# Patient Record
Sex: Female | Born: 1995 | Race: White | Hispanic: No | Marital: Married | Smoking: Never smoker
Health system: Southern US, Community
[De-identification: ages and names within clinical notes are randomized; demographics above are authoritative.]

## PROBLEM LIST (undated history)

## (undated) DIAGNOSIS — N926 Irregular menstruation, unspecified: Secondary | ICD-10-CM

## (undated) DIAGNOSIS — E669 Obesity, unspecified: Secondary | ICD-10-CM

## (undated) HISTORY — PX: TONSILLECTOMY: SUR1361

## (undated) HISTORY — DX: Irregular menstruation, unspecified: N92.6

## (undated) HISTORY — DX: Obesity, unspecified: E66.9

---
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
## 2015-10-28 DIAGNOSIS — Z01419 Encounter for gynecological examination (general) (routine) without abnormal findings: Secondary | ICD-10-CM | POA: Diagnosis not present

## 2015-10-28 DIAGNOSIS — Z30011 Encounter for initial prescription of contraceptive pills: Secondary | ICD-10-CM | POA: Diagnosis not present

## 2015-10-28 DIAGNOSIS — N898 Other specified noninflammatory disorders of vagina: Secondary | ICD-10-CM | POA: Diagnosis not present

## 2016-01-31 ENCOUNTER — Other Ambulatory Visit: Payer: Self-pay | Admitting: Family Medicine

## 2016-01-31 DIAGNOSIS — R1011 Right upper quadrant pain: Secondary | ICD-10-CM

## 2016-02-01 ENCOUNTER — Ambulatory Visit
Admission: RE | Admit: 2016-02-01 | Discharge: 2016-02-01 | Disposition: A | Discharge status: Home / Self Care | Payer: 59 | Source: Ambulatory Visit | Attending: Family Medicine | Admitting: Family Medicine

## 2016-02-01 DIAGNOSIS — R1011 Right upper quadrant pain: Secondary | ICD-10-CM | POA: Insufficient documentation

## 2016-07-03 IMAGING — US US ABDOMEN LIMITED
1 series · 14 of 25 positions shown · non-contrast
Comparison: None in PACs

CLINICAL DATA: Right upper quadrant pain for the past 3 days
without nausea or vomiting.

EXAM:
US ABDOMEN LIMITED - RIGHT UPPER QUADRANT

[Series 1: us abdomen limited · 0.28mm/px · 14 of 49 slices shown]
[im 1/49]
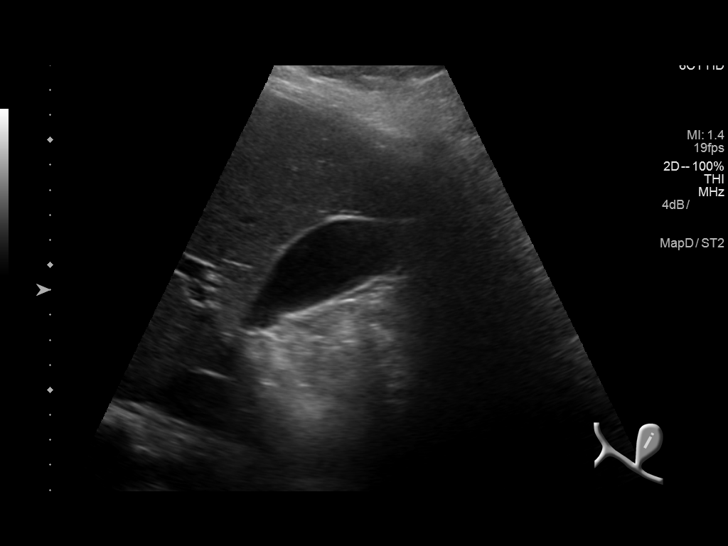
[im 5/49]
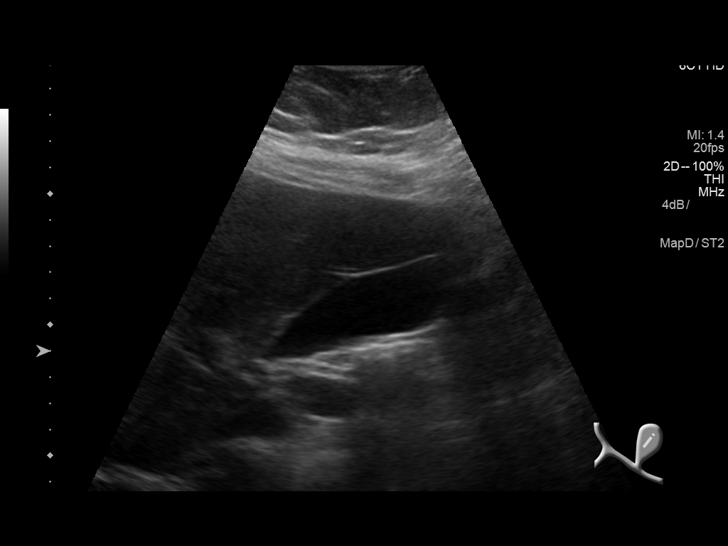
[im 9/49]
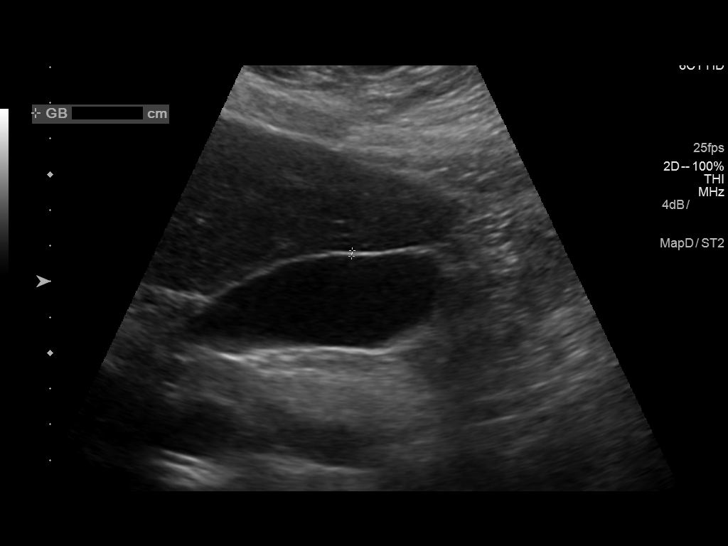
[im 13/49]
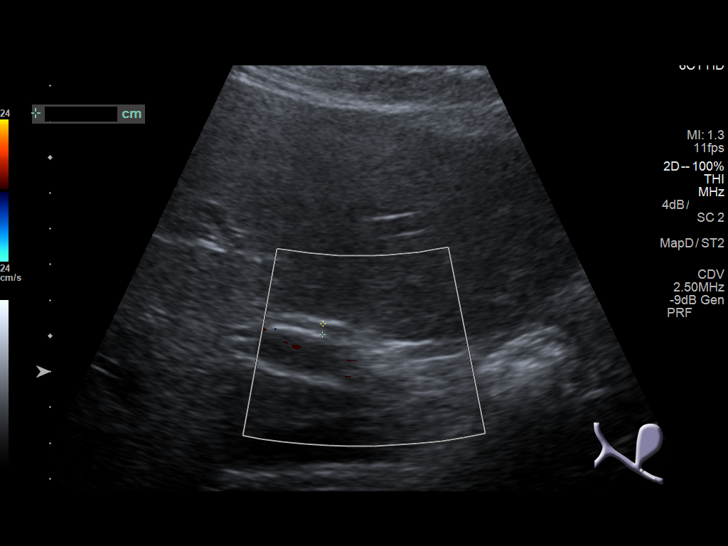
[im 17/49]
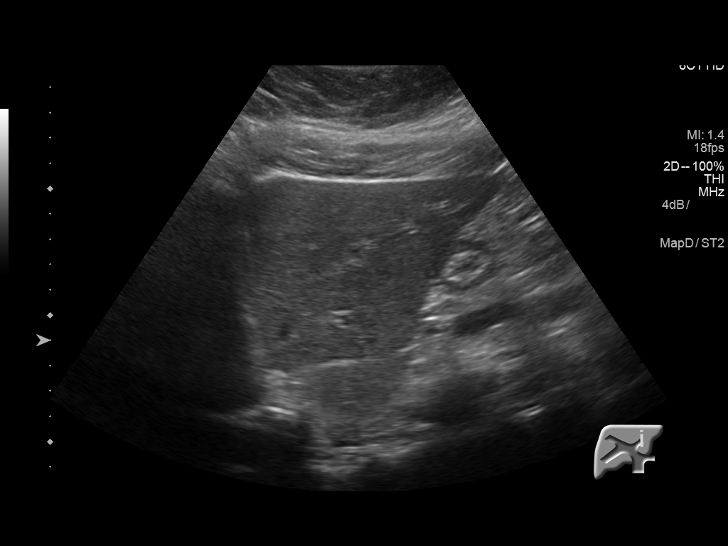
[im 19/49]
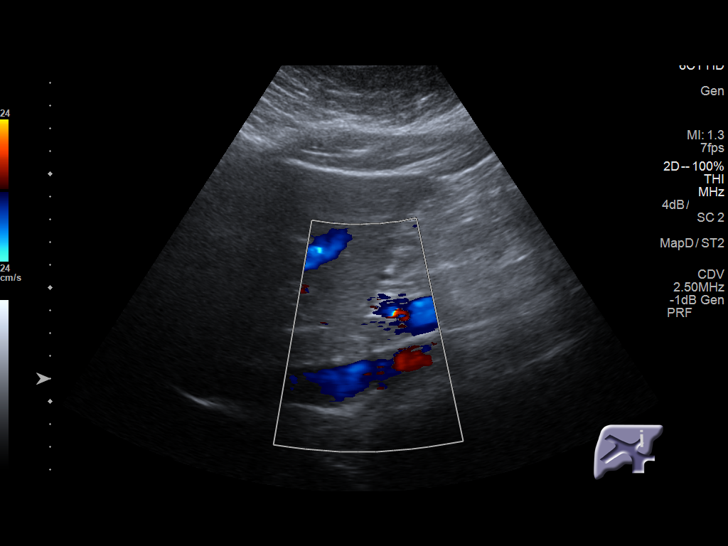
[im 23/49]
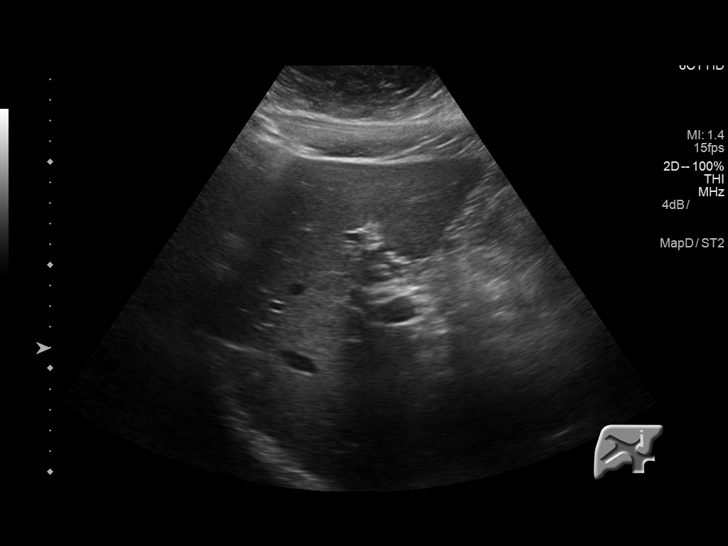
[im 27/49]
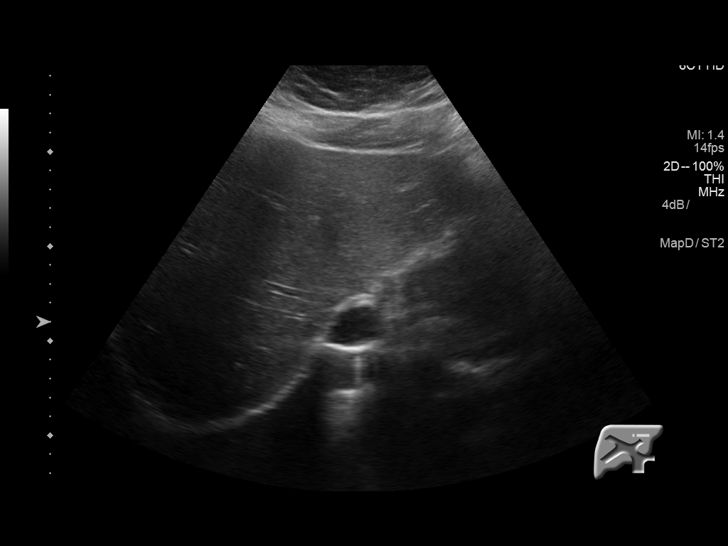
[im 31/49]
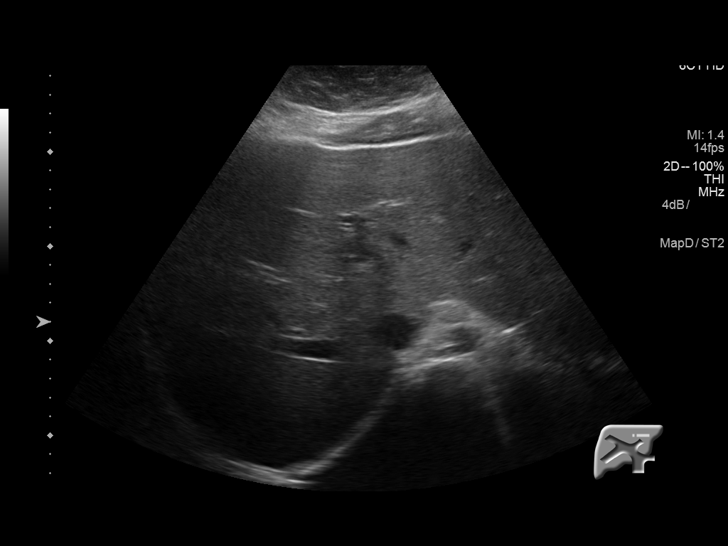
[im 33/49]
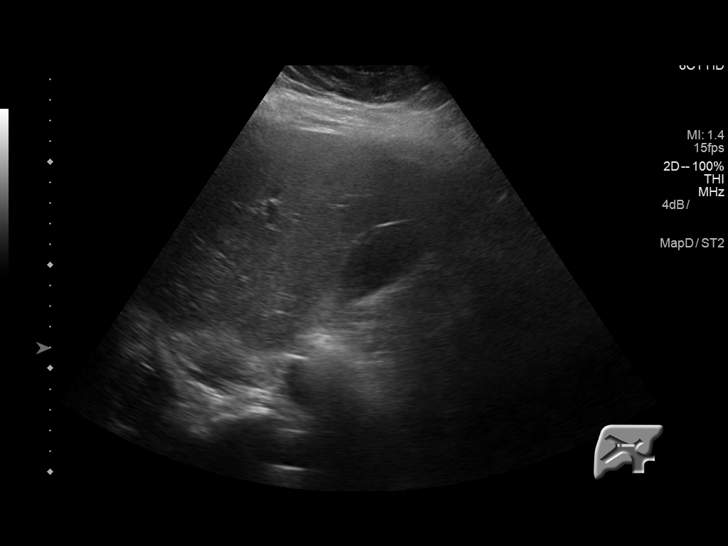
[im 37/49]
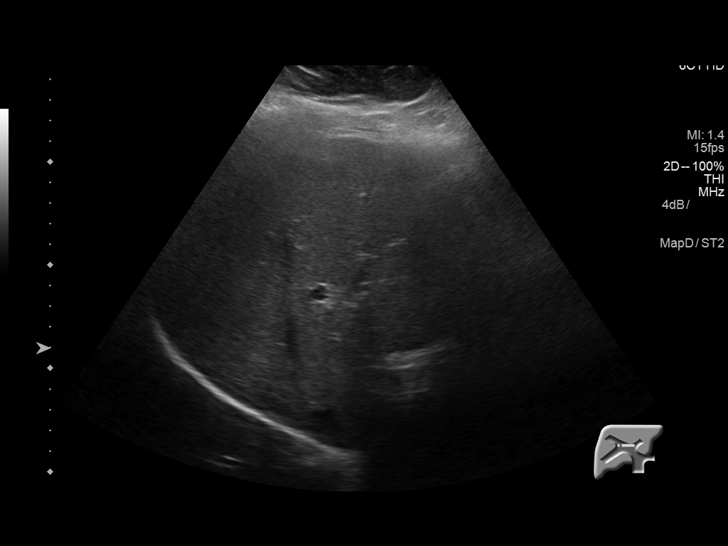
[im 41/49]
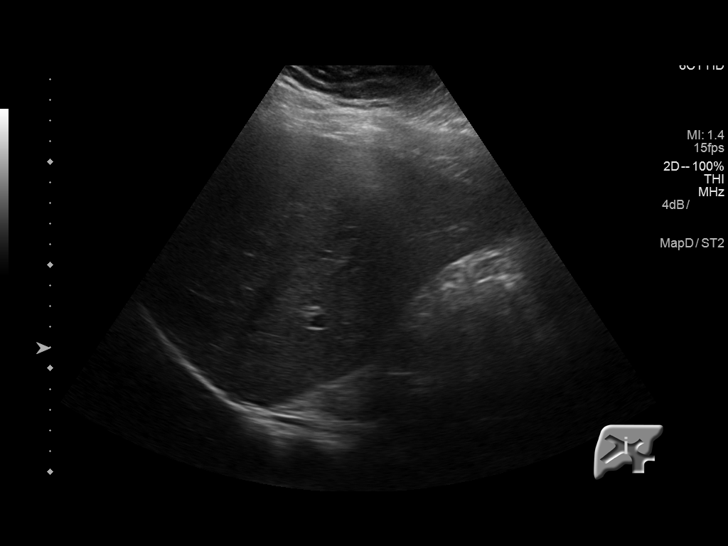
[im 45/49]
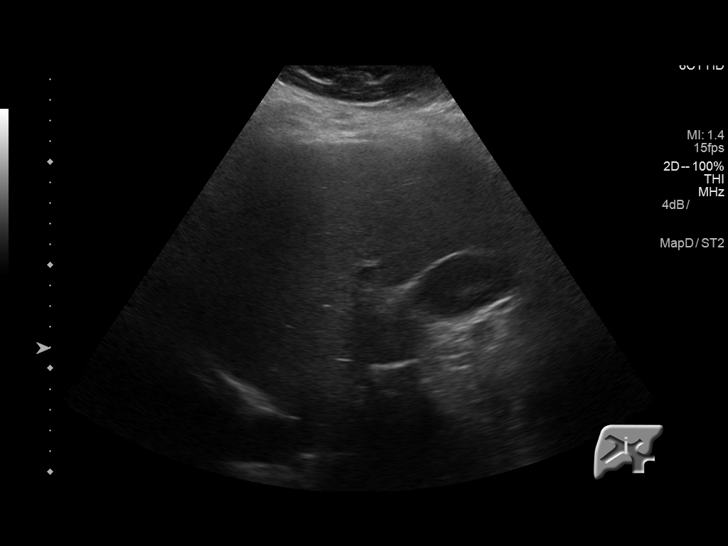
[im 49/49]
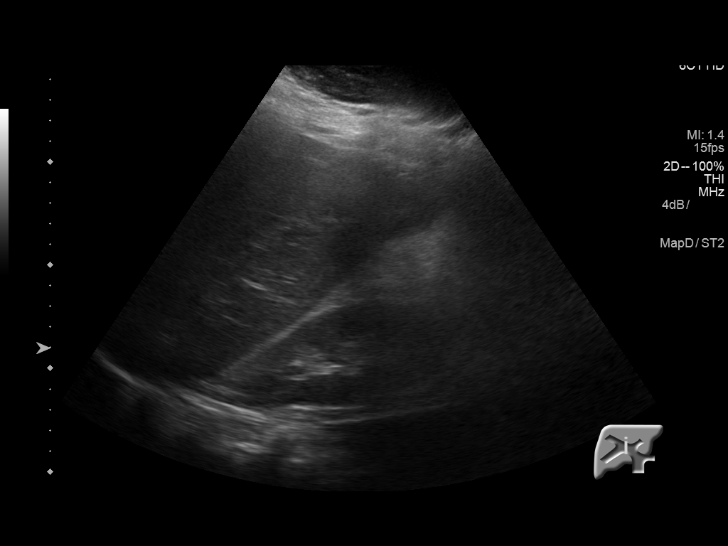

[14 of 25 positions shown; findings below may reference images not displayed]

FINDINGS: Gallbladder:

The gallbladder is adequately distended with no evidence of stones,
wall thickening, or pericholecystic fluid. There is no positive
sonographic Murphy's sign.

Common bile duct:

Diameter: 3 mm

Liver:

The hepatic echotexture is normal. There is no focal mass nor ductal
dilation.
IMPRESSION: Normal limited right upper quadrant ultrasound examination. If
gallbladder dysfunction is felt to be likely, a nuclear medicine
hepatobiliary scan with gallbladder ejection fraction may be useful.

## 2016-11-01 DIAGNOSIS — Z01419 Encounter for gynecological examination (general) (routine) without abnormal findings: Secondary | ICD-10-CM | POA: Diagnosis not present

## 2017-06-11 ENCOUNTER — Telehealth: Payer: Self-pay | Admitting: Internal Medicine

## 2017-06-11 NOTE — Telephone Encounter (Signed)
Pt will call to set up a new patient appt with Dr. Darrick Huntsmanullo. Dr. Darrick Huntsmanullo has ok'd this.-kp

## 2017-07-03 ENCOUNTER — Ambulatory Visit (INDEPENDENT_AMBULATORY_CARE_PROVIDER_SITE_OTHER): Payer: 59 | Admitting: Internal Medicine

## 2017-07-03 ENCOUNTER — Encounter: Payer: Self-pay | Admitting: Internal Medicine

## 2017-07-03 VITALS — BP 110/72 | HR 69 | Temp 98.3°F | Resp 15 | Ht 68.25 in | Wt 230.2 lb

## 2017-07-03 DIAGNOSIS — E669 Obesity, unspecified: Secondary | ICD-10-CM

## 2017-07-03 DIAGNOSIS — Z23 Encounter for immunization: Secondary | ICD-10-CM | POA: Diagnosis not present

## 2017-07-03 DIAGNOSIS — R5383 Other fatigue: Secondary | ICD-10-CM | POA: Diagnosis not present

## 2017-07-03 LAB — TSH: TSH: 2.26 u[IU]/mL (ref 0.35–4.50)

## 2017-07-03 LAB — COMPREHENSIVE METABOLIC PANEL
ALBUMIN: 4 g/dL (ref 3.5–5.2)
ALK PHOS: 57 U/L (ref 39–117)
ALT: 12 U/L (ref 0–35)
AST: 12 U/L (ref 0–37)
BILIRUBIN TOTAL: 0.3 mg/dL (ref 0.2–1.2)
BUN: 13 mg/dL (ref 6–23)
CO2: 28 mEq/L (ref 19–32)
CREATININE: 0.55 mg/dL (ref 0.40–1.20)
Calcium: 9.8 mg/dL (ref 8.4–10.5)
Chloride: 104 mEq/L (ref 96–112)
GFR: 144.48 mL/min (ref >60.00)
Glucose, Bld: 85 mg/dL (ref 70–99)
Potassium: 4.2 mEq/L (ref 3.5–5.1)
SODIUM: 137 meq/L (ref 135–145)
TOTAL PROTEIN: 7.1 g/dL (ref 6.0–8.3)

## 2017-07-03 NOTE — Patient Instructions (Signed)
Good to meet you!  I'll see you in a year,  Sooner if needed

## 2017-07-03 NOTE — Progress Notes (Signed)
   Subjective:  Patient ID: Kristina Kelly, female    DOB: 01/13/1996  Age: 21 y.o. MRN: [id number]  CC: There were no encounter diagnoses.  HPI Kristina Kelly presents for establishment of care,  She was referred by her mother and her husband    She has a history of morbid obesity,  Has lost 60 lbs since March using phentermine and B12  Injections that were previously prescribed by her  OB GYN.   She and her husband have been losing weight together by following the KET[employer] diet and exercising regularly.  Kristina Kelly has no past medical history on file.   She has no past surgical history on file.   Her family history is not on file.She has no tobacco, alcohol, and drug history on file.  No outpatient medications prior to visit.   No facility-administered medications prior to visit.     Review of Systems:  Patient denies headache, fevers, malaise, unintentional weight loss, skin rash, eye pain, sinus congestion and sinus pain, sore throat, dysphagia,  hemoptysis , cough, dyspnea, wheezing, chest pain, palpitations, orthopnea, edema, abdominal pain, nausea, melena, diarrhea, constipation, flank pain, dysuria, hematuria, urinary  Frequency, nocturia, numbness, tingling, seizures,  Focal weakness, Loss of consciousness,  Tremor, insomnia, depression, anxiety, and suicidal ideation.     Objective:  BP 110/72 (BP Location: Left Arm, Patient Position: Sitting, Cuff Size: Large)   Pulse 69   Temp 98.3 F (36.8 C) (Oral)   Resp 15   Ht 5' 8.25" (1.734 m)   Wt 230 lb 3.2 oz (104.4 kg)   SpO2 97%   BMI 34.75 kg/m   Physical Exam:  General appearance: alert, cooperative and appears stated age Ears: normal TM's and external ear canals both ears Throat: lips, mucosa, and tongue normal; teeth and gums normal Neck: no adenopathy, no carotid bruit, supple, symmetrical, trachea midline and thyroid not enlarged, symmetric, no tenderness/mass/nodules Back: symmetric, no curvature. ROM normal. No CVA  tenderness. Lungs: clear to auscultation bilaterally Heart: regular rate and rhythm, S1, S2 normal, no murmur, click, rub or gallop Abdomen: soft, non-tender; bowel sounds normal; no masses,  no organomegaly Pulses: 2+ and symmetric Skin: Skin color, texture, turgor normal. No rashes or lesions Lymph nodes: Cervical, supraclavicular, and axillary nodes normal.   Assessment & Plan:   Problem List Items Addressed This Visit    None      Kristina ParrSarah M. Kelly does not currently have medications on file.  No orders of the defined types were placed in this encounter.   There are no discontinued medications.  Follow-up: No Follow-up on file.   Sherlene Shamseresa L Lorenia Hoston, MD

## 2017-07-05 ENCOUNTER — Encounter: Payer: Self-pay | Admitting: Internal Medicine

## 2017-07-05 DIAGNOSIS — R5383 Other fatigue: Secondary | ICD-10-CM | POA: Insufficient documentation

## 2017-07-05 DIAGNOSIS — E669 Obesity, unspecified: Secondary | ICD-10-CM | POA: Insufficient documentation

## 2017-07-05 NOTE — Assessment & Plan Note (Signed)
I have congratulated her in reduction of   BMI and encouraged  Continued weight loss with goal of 10% of body weigh over the next 6 months using a low glycemic index diet and regular exercise a minimum of 5 days per week.    

## 2017-07-05 NOTE — Assessment & Plan Note (Signed)
Etiology unclear.  Will screen for thyroid, anemia,  hepatic and renal insufficiency, encourage regular exercise 5 days /week,  consider sleep study and cardiology evaluation if  Snoring noted or exertional dyspnea reported 

## 2017-11-05 DIAGNOSIS — Z01419 Encounter for gynecological examination (general) (routine) without abnormal findings: Secondary | ICD-10-CM | POA: Diagnosis not present

## 2018-01-02 ENCOUNTER — Encounter: Payer: Self-pay | Admitting: Internal Medicine

## 2018-01-03 ENCOUNTER — Telehealth: Payer: Self-pay

## 2018-01-03 NOTE — Telephone Encounter (Signed)
LMTCB. Need to let pt know that we have a form for her that has been completed. There is not a fax number on it so we weren't sure if we needed to fax it or have her to come by and pick it up. PEC may speak with pt.

## 2018-03-04 ENCOUNTER — Encounter: Payer: Self-pay | Admitting: Internal Medicine

## 2018-03-05 ENCOUNTER — Encounter: Payer: Self-pay | Admitting: Internal Medicine

## 2018-03-06 ENCOUNTER — Ambulatory Visit: Payer: 59 | Admitting: Internal Medicine

## 2018-05-29 DIAGNOSIS — Z23 Encounter for immunization: Secondary | ICD-10-CM | POA: Diagnosis not present

## 2018-08-14 NOTE — L&D Delivery Note (Signed)
       Delivery Note   Kristina Kelly is a 23 y.o. G1P0 at [redacted]w[redacted]d Estimated Date of Delivery: 08/16/19  PRE-OPERATIVE DIAGNOSIS:  1) [redacted]w[redacted]d pregnancy.   POST-OPERATIVE DIAGNOSIS:  1) [redacted]w[redacted]d pregnancy s/p Vaginal, Spontaneous   Delivery Type: Vaginal, Spontaneous    Delivery Anesthesia: Epidural   Labor Complications:  compound presentation    ESTIMATED BLOOD LOSS: 230  ml    FINDINGS:   1) female infant, Apgar scores of 8   at 1 minute and 8   at 5 minutes and a birthweight of 107.23  ounces.    2) Nuchal cord: yes across shoulder  SPECIMENS:   PLACENTA:   Appearance: Intact , 3 vessel cord, cod blood sample collected   Removal: Spontaneous      Disposition:  held per protocol then discarded  DISPOSITION:  Infant to left in stable condition in the delivery room, with L&D personnel and mother,  NARRATIVE SUMMARY: Labor course:  Ms. Kristina Kelly is a G1P0 at [redacted]w[redacted]d who presented for induction of labor.  She progressed well in labor with pitocin.  She received the appropriate epidural anesthesia and proceeded to complete dilation. She evidenced good maternal expulsive effort during the second stage. She went on to deliver a viable female infant 'Adalyn". The placenta delivered without problems and was noted to be complete. A perineal and vaginal examination was performed. Episiotomy/Lacerations: 2nd degree;Perineal  Laceration was repaired with 3-0 Vicryl Rapide suture using. The patient tolerated this well.  Philip Aspen, CNM  08/14/2019 9:18 AM

## 2019-01-02 ENCOUNTER — Telehealth: Payer: Self-pay | Admitting: *Deleted

## 2019-01-02 NOTE — Telephone Encounter (Signed)
Coronavirus (COVID-19) Are you at risk?  Are you at risk for the Coronavirus (COVID-19)?  To be considered HIGH RISK for Coronavirus (COVID-19), you have to meet the following criteria:  . Traveled to [place], [place], [place], [place] or [place]; or in the [place] to [place], [place], [place], or [place]; and have fever, cough, and shortness of breath within the last 2 weeks of travel OR . Been in close contact with a person diagnosed with COVID-19 within the last 2 weeks and have fever, cough, and shortness of breath . IF YOU DO NOT MEET THESE CRITERIA, YOU ARE CONSIDERED LOW RISK FOR COVID-19.  What to do if you are HIGH RISK for COVID-19?  . If you are having a medical emergency, call 911. . Seek medical care right away. Before you go to a doctor's office, urgent care or emergency department, call ahead and tell them about your recent travel, contact with someone diagnosed with COVID-19, and your symptoms. You should receive instructions from your physician's office regarding next steps of care.  . When you arrive at healthcare provider, tell the healthcare staff immediately you have returned from visiting [place], [place], [place], [place] or [place]; or traveled in the [place] to [place], [place], [place], or [place]; in the last two weeks or you have been in close contact with a person diagnosed with COVID-19 in the last 2 weeks.   . Tell the health care staff about your symptoms: fever, cough and shortness of breath. . After you have been seen by a medical provider, you will be either: o Tested for (COVID-19) and discharged home on quarantine except to seek medical care if symptoms worsen, and asked to  - Stay home and avoid contact with others until you get your results (4-5 days)  - Avoid travel on public transportation if possible (such as bus, train, or airplane) or o Sent to the Emergency Department by EMS for evaluation, COVID-19 testing, and possible  admission depending on your condition and test results.  What to do if you are LOW RISK for COVID-19?  Reduce your risk of any infection by using the same precautions used for avoiding the common cold or flu:  . Wash your hands often with soap and warm water for at least 20 seconds.  If soap and water are not readily available, use an alcohol-based hand sanitizer with at least 60% alcohol.  . If coughing or sneezing, cover your mouth and nose by coughing or sneezing into the elbow areas of your shirt or coat, into a tissue or into your sleeve (not your hands). . Avoid shaking hands with others and consider head nods or verbal greetings only. . Avoid touching your eyes, nose, or mouth with unwashed hands.  . Avoid close contact with people who are sick. . Avoid places or events with large numbers of people in one location, like concerts or sporting events. . Carefully consider travel plans you have or are making. . If you are planning any travel outside or inside the [place], visit the CDC's Travelers' Health webpage for the latest health notices. . If you have some symptoms but not all symptoms, continue to monitor at home and seek medical attention if your symptoms worsen. . If you are having a medical emergency, call 911.   ADDITIONAL HEALTHCARE OPTIONS FOR PATIENTS  [facility] / e-Visit: [email or web address]         [facility]: [phone]     Urgent Care: [phone]                   [facility]: [phone]   Spoke with pt denies any sx.  Amy Clontz, CMA 

## 2019-01-03 ENCOUNTER — Encounter: Payer: Self-pay | Admitting: Obstetrics and Gynecology

## 2019-01-03 ENCOUNTER — Other Ambulatory Visit: Payer: Self-pay

## 2019-01-03 ENCOUNTER — Ambulatory Visit (INDEPENDENT_AMBULATORY_CARE_PROVIDER_SITE_OTHER): Payer: 59 | Admitting: Obstetrics and Gynecology

## 2019-01-03 VITALS — BP 110/75 | HR 73 | Ht 69.0 in | Wt 239.5 lb

## 2019-01-03 DIAGNOSIS — Z6835 Body mass index (BMI) 35.0-35.9, adult: Secondary | ICD-10-CM

## 2019-01-03 DIAGNOSIS — N926 Irregular menstruation, unspecified: Secondary | ICD-10-CM

## 2019-01-03 LAB — POCT URINE PREGNANCY: Preg Test, Ur: POSITIVE — AB

## 2019-01-03 NOTE — Patient Instructions (Addendum)
Thank you for enrolling in [place]. Please follow the instructions below to securely access your online medical record. MyChart allows you to send messages to your doctor, view your test results, renew your prescriptions, schedule appointments, and more.  How Do I Sign Up? 1. In your Internet browser, go to [email or web address]. 2. Click on the New  User? link in the Sign In box.  3. Enter your MyChart Access Code exactly as it appears below. You will not need to use this code after you have completed the sign-up process. If you do not sign up before the expiration date, you must request a new code. MyChart Access Code: Activation code not generated Current MyChart Status: Active  4. Enter the last four digits of your Social Security Number (xxxx) and Date of Birth (mm/dd/yyyy) as indicated and click Next. You will be taken to the next sign-up page. 5. Create a MyChart ID. This will be your MyChart login ID and cannot be changed, so think of one that is secure and easy to remember. 6. Create a MyChart password. You can change your password at any time. 7. Enter your Password Reset Question and Answer and click Next. This can be used at a later time if you forget your password.  8. Select your communication preference, and if applicable enter your e-mail address. You will receive e-mail notification when new information is available in MyChart by choosing to receive e-mail notifications and filling in your e-mail. 9. Click Sign In. You can now view your medical record.   Additional Information If you have questions, you can email [email or web address] or call [phone] to talk to our [place] staff. Remember, MyChart is NOT to be used for urgent needs. For medical emergencies, dial 911.   First Trimester of Pregnancy The first trimester of pregnancy is from week 1 until the end of week 13 (months 1 through 3). A week after a sperm fertilizes an egg, the egg will  implant on the wall of the uterus. This embryo will begin to develop into a baby. Genes from you and your partner will form the baby. The female genes will determine whether the baby will be a boy or a girl. At 6-8 weeks, the eyes and face will be formed, and the heartbeat can be seen on ultrasound. At the end of 12 weeks, all the baby's organs will be formed. Now that you are pregnant, you will want to do everything you can to have a healthy baby. Two of the most important things are to get good prenatal care and to follow your health care provider's instructions. Prenatal care is all the medical care you receive before the baby's birth. This care will help prevent, find, and treat any problems during the pregnancy and childbirth. Body changes during your first trimester Your body goes through many changes during pregnancy. The changes vary from woman to woman.  You may gain or lose a couple of pounds at first.  You may feel sick to your stomach (nauseous) and you may throw up (vomit). If the vomiting is uncontrollable, call your health care provider.  You may tire easily.  You may develop headaches that can be relieved by medicines. All medicines should be approved by your health care provider.  You may urinate more often. Painful urination may mean you have a bladder infection.  You may develop heartburn as a result of your pregnancy.  You may develop constipation because certain hormones are causing the  muscles that push stool through your intestines to slow down.  You may develop hemorrhoids or swollen veins (varicose veins).  Your breasts may begin to grow larger and become tender. Your nipples may stick out more, and the tissue that surrounds them (areola) may become darker.  Your gums may bleed and may be sensitive to brushing and flossing.  Dark spots or blotches (chloasma, mask of pregnancy) may develop on your face. This will likely fade after the baby is born.  Your menstrual  periods will stop.  You may have a loss of appetite.  You may develop cravings for certain kinds of food.  You may have changes in your emotions from day to day, such as being excited to be pregnant or being concerned that something may go wrong with the pregnancy and baby.  You may have more vivid and strange dreams.  You may have changes in your hair. These can include thickening of your hair, rapid growth, and changes in texture. Some women also have hair loss during or after pregnancy, or hair that feels dry or thin. Your hair will most likely return to normal after your baby is born. What to expect at prenatal visits During a routine prenatal visit:  You will be weighed to make sure you and the baby are growing normally.  Your blood pressure will be taken.  Your abdomen will be measured to track your baby's growth.  The fetal heartbeat will be listened to between weeks 10 and 14 of your pregnancy.  Test results from any previous visits will be discussed. Your health care provider may ask you:  How you are feeling.  If you are feeling the baby move.  If you have had any abnormal symptoms, such as leaking fluid, bleeding, severe headaches, or abdominal cramping.  If you are using any tobacco products, including cigarettes, chewing tobacco, and electronic cigarettes.  If you have any questions. Other tests that may be performed during your first trimester include:  Blood tests to find your blood type and to check for the presence of any previous infections. The tests will also be used to check for low iron levels (anemia) and protein on red blood cells (Rh antibodies). Depending on your risk factors, or if you previously had diabetes during pregnancy, you may have tests to check for high blood sugar that affects pregnant women (gestational diabetes).  Urine tests to check for infections, diabetes, or protein in the urine.  An ultrasound to confirm the proper growth and  development of the baby.  Fetal screens for spinal cord problems (spina bifida) and Down syndrome.  HIV (human immunodeficiency virus) testing. Routine prenatal testing includes screening for HIV, unless you choose not to have this test.  You may need other tests to make sure you and the baby are doing well. Follow these instructions at home: Medicines  Follow your health care provider's instructions regarding medicine use. Specific medicines may be either safe or unsafe to take during pregnancy.  Take a prenatal vitamin that contains at least 600 micrograms (mcg) of folic acid.  If you develop constipation, try taking a stool softener if your health care provider approves. Eating and drinking   Eat a balanced diet that includes fresh fruits and vegetables, whole grains, good sources of protein such as meat, eggs, or tofu, and low-fat dairy. Your health care provider will help you determine the amount of weight gain that is right for you.  Avoid raw meat and uncooked cheese. These carry  germs that can cause birth defects in the baby.  Eating four or five small meals rather than three large meals a day may help relieve nausea and vomiting. If you start to feel nauseous, eating a few soda crackers can be helpful. Drinking liquids between meals, instead of during meals, also seems to help ease nausea and vomiting.  Limit foods that are high in fat and processed sugars, such as fried and sweet foods.  To prevent constipation: ? Eat foods that are high in fiber, such as fresh fruits and vegetables, whole grains, and beans. ? Drink enough fluid to keep your urine clear or pale yellow. Activity  Exercise only as directed by your health care provider. Most women can continue their usual exercise routine during pregnancy. Try to exercise for 30 minutes at least 5 days a week. Exercising will help you: ? Control your weight. ? Stay in shape. ? Be prepared for labor and  delivery.  Experiencing pain or cramping in the lower abdomen or lower back is a good sign that you should stop exercising. Check with your health care provider before continuing with normal exercises.  Try to avoid standing for long periods of time. Move your legs often if you must stand in one place for a long time.  Avoid heavy lifting.  Wear low-heeled shoes and practice good posture.  You may continue to have sex unless your health care provider tells you not to. Relieving pain and discomfort  Wear a good support bra to relieve breast tenderness.  Take warm sitz baths to soothe any pain or discomfort caused by hemorrhoids. Use hemorrhoid cream if your health care provider approves.  Rest with your legs elevated if you have leg cramps or low back pain.  If you develop varicose veins in your legs, wear support hose. Elevate your feet for 15 minutes, 3-4 times a day. Limit salt in your diet. Prenatal care  Schedule your prenatal visits by the twelfth week of pregnancy. They are usually scheduled monthly at first, then more often in the last 2 months before delivery.  Write down your questions. Take them to your prenatal visits.  Keep all your prenatal visits as told by your health care provider. This is important. Safety  Wear your seat belt at all times when driving.  Make a list of emergency phone numbers, including numbers for family, friends, the hospital, and police and fire departments. General instructions  Ask your health care provider for a referral to a local prenatal education class. Begin classes no later than the beginning of month 6 of your pregnancy.  Ask for help if you have counseling or nutritional needs during pregnancy. Your health care provider can offer advice or refer you to specialists for help with various needs.  Do not use hot tubs, steam rooms, or saunas.  Do not douche or use tampons or scented sanitary pads.  Do not cross your legs for long  periods of time.  Avoid cat litter boxes and soil used by cats. These carry germs that can cause birth defects in the baby and possibly loss of the fetus by miscarriage or stillbirth.  Avoid all smoking, herbs, alcohol, and medicines not prescribed by your health care provider. Chemicals in these products affect the formation and growth of the baby.  Do not use any products that contain nicotine or tobacco, such as cigarettes and e-cigarettes. If you need help quitting, ask your health care provider. You may receive counseling support and other resources  to help you quit.  Schedule a dentist appointment. At home, brush your teeth with a soft toothbrush and be gentle when you floss. Contact a health care provider if:  You have dizziness.  You have mild pelvic cramps, pelvic pressure, or nagging pain in the abdominal area.  You have persistent nausea, vomiting, or diarrhea.  You have a bad smelling vaginal discharge.  You have pain when you urinate.  You notice increased swelling in your face, hands, legs, or ankles.  You are exposed to fifth disease or chickenpox.  You are exposed to [place] measles (rubella) and have never had it. Get help right away if:  You have a fever.  You are leaking fluid from your vagina.  You have spotting or bleeding from your vagina.  You have severe abdominal cramping or pain.  You have rapid weight gain or loss.  You vomit blood or material that looks like coffee grounds.  You develop a severe headache.  You have shortness of breath.  You have any kind of trauma, such as from a fall or a car accident. Summary  The first trimester of pregnancy is from week 1 until the end of week 13 (months 1 through 3).  Your body goes through many changes during pregnancy. The changes vary from woman to woman.  You will have routine prenatal visits. During those visits, your health care provider will examine you, discuss any test results you may have,  and talk with you about how you are feeling. This information is not intended to replace advice given to you by your health care provider. Make sure you discuss any questions you have with your health care provider. Document Released: 07/25/2001 Document Revised: 07/12/2016 Document Reviewed: 07/12/2016 Elsevier Interactive Patient Education  2019 [place] for Pregnant Women While you are pregnant, your body requires additional nutrition to help support your growing baby. You also have a higher need for some vitamins and minerals, such as folic acid, calcium, iron, and vitamin D. Eating a healthy, well-balanced diet is very important for your health and your baby's health. Your need for extra calories varies for the three 61-monthsegments of your pregnancy (trimesters). For most women, it is recommended to consume:  150 extra calories a day during the first trimester.  300 extra calories a day during the second trimester.  300 extra calories a day during the third trimester. What are tips for following this plan?   Do not try to lose weight or go on a diet during pregnancy.  Limit your overall intake of foods that have "empty calories." These are foods that have little nutritional value, such as sweets, desserts, candies, and sugar-sweetened beverages.  Eat a variety of foods (especially fruits and vegetables) to get a full range of vitamins and minerals.  Take a prenatal vitamin to help meet your additional vitamin and mineral needs during pregnancy, specifically for folic acid, iron, calcium, and vitamin D.  Remember to stay active. Ask your health care provider what types of exercise and activities are safe for you.  Practice good food safety and cleanliness. Wash your hands before you eat and after you prepare raw meat. Wash all fruits and vegetables well before peeling or eating. Taking these actions can help to prevent food-borne illnesses that can be very dangerous  to your baby, such as listeriosis. Ask your health care provider for more information about listeriosis. What does 150 extra calories look like? Healthy options that provide 150 extra calories  each day could be any of the following:  6-8 oz (170-230 g) of plain low-fat yogurt with  cup of berries.  1 apple with 2 teaspoons (11 g) of peanut butter.  Cut-up vegetables with  cup (60 g) of hummus.  8 oz (230 mL) or 1 cup of low-fat chocolate milk.  1 stick of string cheese with 1 medium orange.  1 peanut butter and jelly sandwich that is made with one slice of whole-wheat bread and 1 tsp (5 g) of peanut butter. For 300 extra calories, you could eat two of those healthy options each day. What is a healthy amount of weight to gain? The right amount of weight gain for you is based on your BMI before you became pregnant. If your BMI:  Was less than 18 (underweight), you should gain 28-40 lb (13-18 kg).  Was 18-24.9 (normal), you should gain 25-35 lb (11-16 kg).  Was 25-29.9 (overweight), you should gain 15-25 lb (7-11 kg).  Was 30 or greater (obese), you should gain 11-20 lb (5-9 kg). What if I am having twins or multiples? Generally, if you are carrying twins or multiples:  You may need to eat 300-600 extra calories a day.  The recommended range for total weight gain is 25-54 lb (11-25 kg), depending on your BMI before pregnancy.  Talk with your health care provider to find out about nutritional needs, weight gain, and exercise that is right for you. What foods can I eat?  Grains All grains. Choose whole grains, such as whole-wheat bread, oatmeal, or brown rice. Vegetables All vegetables. Eat a variety of colors and types of vegetables. Remember to wash your vegetables well before peeling or eating. Fruits All fruits. Eat a variety of colors and types of fruit. Remember to wash your fruits well before peeling or eating. Meats and other protein foods Lean meats, including  chicken, [place], fish, and lean cuts of beef, veal, or pork. If you eat fish or seafood, choose options that are higher in omega-3 fatty acids and lower in mercury, such as salmon, herring, mussels, trout, sardines, pollock, shrimp, crab, and lobster. Tofu. Tempeh. Beans. Eggs. Peanut butter and other nut butters. Make sure that all meats, poultry, and eggs are cooked to food-safe temperatures or "well-done." Two or more servings of fish are recommended each week in order to get the most benefits from omega-3 fatty acids that are found in seafood. Choose fish that are lower in mercury. You can find more information online:  GuamGaming.ch Dairy Pasteurized milk and milk alternatives (such as almond milk). Pasteurized yogurt and pasteurized cheese. Cottage cheese. Sour cream. Beverages Water. Juices that contain 100% fruit juice or vegetable juice. Caffeine-free teas and decaffeinated coffee. Drinks that contain caffeine are okay to drink, but it is better to avoid caffeine. Keep your total caffeine intake to less than 200 mg each day (which is 12 oz or 355 mL of coffee, tea, or soda) or the limit as told by your health care provider. Fats and oils Fats and oils are okay to include in moderation. Sweets and desserts Sweets and desserts are okay to include in moderation. Seasoning and other foods All pasteurized condiments. The items listed above may not be a complete list of recommended foods and beverages. Contact your dietitian for more options. What foods are not recommended? Vegetables Raw (unpasteurized) vegetable juices. Fruits Unpasteurized fruit juices. Meats and other protein foods Lunch meats, bologna, hot dogs, or other deli meats. (If you must eat those meats, reheat  them until they are steaming hot.) Refrigerated pat, meat spreads from a meat counter, smoked seafood that is found in the refrigerated section of a store. Raw or undercooked meats, poultry, and eggs. Raw fish, such as  sushi or sashimi. Fish that have high mercury content, such as tilefish, shark, swordfish, and king mackerel. To learn more about mercury in fish, talk with your health care provider or look for online resources, such as:  [place]Gaming.ch Dairy Raw (unpasteurized) milk and any foods that have raw milk in them. Soft cheeses, such as feta, queso blanco, queso fresco, Brie, Camembert cheeses, blue-veined cheeses, and Panela cheese (unless it is made with pasteurized milk, which must be stated on the label). Beverages Alcohol. Sugar-sweetened beverages, such as sodas, teas, or energy drinks. Seasoning and other foods Homemade fermented foods and drinks, such as pickles, sauerkraut, or kombucha drinks. (Store-bought pasteurized versions of these are okay.) Salads that are made in a store or deli, such as ham salad, chicken salad, egg salad, tuna salad, and seafood salad. The items listed above may not be a complete list of foods and beverages to avoid. Contact your dietitian for more information. Where to find more information To calculate the number of calories you need based on your height, weight, and activity level, you can use an online calculator such as:  MobileTransition.ch To calculate how much weight you should gain during pregnancy, you can use an online pregnancy weight gain calculator such as:  StreamingFood.com.cy Summary  While you are pregnant, your body requires additional nutrition to help support your growing baby.  Eat a variety of foods, especially fruits and vegetables to get a full range of vitamins and minerals.  Practice good food safety and cleanliness. Wash your hands before you eat and after you prepare raw meat. Wash all fruits and vegetables well before peeling or eating. Taking these actions can help to prevent food-borne illnesses, such as listeriosis, that can be very dangerous to your baby.  Do not eat raw meat or  fish. Do not eat fish that have high mercury content, such as tilefish, shark, swordfish, and king mackerel. Do not eat unpasteurized (raw) dairy.  Take a prenatal vitamin to help meet your additional vitamin and mineral needs during pregnancy, specifically for folic acid, iron, calcium, and vitamin D. This information is not intended to replace advice given to you by your health care provider. Make sure you discuss any questions you have with your health care provider. Document Released: 05/15/2014 Document Revised: 04/27/2017 Document Reviewed: 04/27/2017 Elsevier Interactive Patient Education  2019 [employer]. Common Medications Safe in Pregnancy  Acne:      Constipation:  Benzoyl Peroxide     Colace  Clindamycin      Dulcolax Suppository  Topica Erythromycin     Fibercon  Salicylic Acid      Metamucil         Miralax AVOID:        Senakot   Accutane    Cough:  Retin-A       Cough Drops  Tetracycline      Phenergan w/ Codeine if Rx  Minocycline      Robitussin (Plain & DM)  Antibiotics:     Crabs/Lice:  Ceclor       RID  Cephalosporins    AVOID:  E-Mycins      Kwell  Keflex  Macrobid/Macrodantin   Diarrhea:  Penicillin      Kao-Pectate  Zithromax      Imodium AD  PUSH FLUIDS AVOID:       Cipro     Fever:  Tetracycline      Tylenol (Regular or Extra  Minocycline       Strength)  Levaquin      Extra Strength-Do not          Exceed 8 tabs/24 hrs Caffeine:        <27m/day (equiv. To 1 cup of coffee or  approx. 3 12 oz sodas)         Gas: Cold/Hayfever:       Gas-X  Benadryl      Mylicon  Claritin       Phazyme  **Claritin-D        Chlor-Trimeton    Headaches:  Dimetapp      ASA-Free Excedrin  Drixoral-Non-Drowsy     Cold Compress  Mucinex (Guaifenasin)     Tylenol (Regular or Extra  Sudafed/Sudafed-12 Hour     Strength)  **Sudafed PE Pseudoephedrine   Tylenol Cold & Sinus     Vicks Vapor Rub  Zyrtec  **AVOID if Problems With Blood  Pressure         Heartburn: Avoid lying down for at least 1 hour after meals  Aciphex      Maalox     Rash:  Milk of Magnesia     Benadryl    Mylanta       1% Hydrocortisone Cream  Pepcid  Pepcid Complete   Sleep Aids:  Prevacid      Ambien   Prilosec       Benadryl  Rolaids       Chamomile Tea  Tums (Limit 4/day)     Unisom  Zantac       Tylenol PM         Warm milk-add vanilla or  Hemorrhoids:       Sugar for taste  Anusol/Anusol H.C.  (RX: Analapram 2.5%)  Sugar Substitutes:  Hydrocortisone OTC     Ok in moderation  Preparation H      Tucks        Vaseline lotion applied to tissue with wiping    Herpes:     Throat:  Acyclovir      Oragel  Famvir  Valtrex     Vaccines:         Flu Shot Leg Cramps:       *Gardasil  Benadryl      Hepatitis A         Hepatitis B Nasal Spray:       Pneumovax  Saline Nasal Spray     Polio Booster         Tetanus Nausea:       Tuberculosis test or PPD  Vitamin B6 25 mg TID   AVOID:    Dramamine      *Gardasil  Emetrol       Live Poliovirus  Ginger Root 250 mg QID    MMR (measles, mumps &  High Complex Carbs @ Bedtime    rebella)  Sea Bands-Accupressure    Varicella (Chickenpox)  Unisom 1/2 tab TID     *No known complications           If received before Pain:         Known pregnancy;   Darvocet       Resume series after  Lortab        Delivery  Percocet    Yeast:   Tramadol  Femstat  Tylenol 3      Gyne-lotrimin  Ultram       Monistat  Vicodin           MISC:         All Sunscreens           Hair Coloring/highlights          Insect Repellant's          (Including DEET)         Mystic Tans Commonly Asked Questions During Pregnancy  Cats: A parasite can be excreted in cat feces.  To avoid exposure you need to have another person empty the little box.  If you must empty the litter box you will need to wear gloves.  Wash your hands after handling your cat.  This parasite can also be found in raw or undercooked meat so  this should also be avoided.  Colds, Sore Throats, Flu: Please check your medication sheet to see what you can take for symptoms.  If your symptoms are unrelieved by these medications please call the office.  Dental Work: Most any dental work Investment banker, corporate recommends is permitted.  X-rays should only be taken during the first trimester if absolutely necessary.  Your abdomen should be shielded with a lead apron during all x-rays.  Please notify your provider prior to receiving any x-rays.  Novocaine is fine; gas is not recommended.  If your dentist requires a note from [place] prior to dental work please call the office and we will provide one for you.  Exercise: Exercise is an important part of staying healthy during your pregnancy.  You may continue most exercises you were accustomed to prior to pregnancy.  Later in your pregnancy you will most likely notice you have difficulty with activities requiring balance like riding a bicycle.  It is important that you listen to your body and avoid activities that put you at a higher risk of falling.  Adequate rest and staying well hydrated are a must!  If you have questions about the safety of specific activities ask your provider.    Exposure to Children with illness: Try to avoid obvious exposure; report any symptoms to [place] when noted,  If you have chicken pos, red measles or mumps, you should be immune to these diseases.   Please do not take any vaccines while pregnant unless you have checked with your OB provider.  Fetal Movement: After 28 weeks we recommend you do "kick counts" twice daily.  Lie or sit down in a calm quiet environment and count your baby movements "kicks".  You should feel your baby at least 10 times per hour.  If you have not felt 10 kicks within the first hour get up, walk around and have something sweet to eat or drink then repeat for an additional hour.  If count remains less than 10 per hour notify your provider.  Fumigating: Follow your pest  control agent's advice as to how long to stay out of your home.  Ventilate the area well before re-entering.  Hemorrhoids:   Most over-the-counter preparations can be used during pregnancy.  Check your medication to see what is safe to use.  It is important to use a stool softener or fiber in your diet and to drink lots of liquids.  If hemorrhoids seem to be getting worse please call the office.   Hot Tubs:  Hot tubs Jacuzzis and saunas are not recommended while pregnant.  These increase your internal  body temperature and should be avoided.  Intercourse:  Sexual intercourse is safe during pregnancy as long as you are comfortable, unless otherwise advised by your provider.  Spotting may occur after intercourse; report any bright red bleeding that is heavier than spotting.  Labor:  If you know that you are in labor, please go to the hospital.  If you are unsure, please call the office and let us help you decide what to do.  Lifting, straining, etc:  If your job requires heavy lifting or straining please check with your provider for any limitations.  Generally, you should not lift items heavier than that you can lift simply with your hands and arms (no back muscles)  Painting:  Paint fumes do not harm your pregnancy, but may make you ill and should be avoided if possible.  Latex or water based paints have less odor than oils.  Use adequate ventilation while painting.  Permanents & Hair Color:  Chemicals in hair dyes are not recommended as they cause increase hair dryness which can increase hair loss during pregnancy.  " Highlighting" and permanents are allowed.  Dye may be absorbed differently and permanents may not hold as well during pregnancy.  Sunbathing:  Use a sunscreen, as skin burns easily during pregnancy.  Drink plenty of fluids; avoid over heating.  Tanning Beds:  Because their possible side effects are still unknown, tanning beds are not recommended.  Ultrasound Scans:  Routine  ultrasounds are performed at approximately 20 weeks.  You will be able to see your baby's general anatomy an if you would like to know the gender this can usually be determined as well.  If it is questionable when you conceived you may also receive an ultrasound early in your pregnancy for dating purposes.  Otherwise ultrasound exams are not routinely performed unless there is a medical necessity.  Although you can request a scan we ask that you pay for it when conducted because insurance does not cover " patient request" scans.  Work: If your pregnancy proceeds without complications you may work until your due date, unless your physician or employer advises otherwise.  Round Ligament Pain/Pelvic Discomfort:  Sharp, shooting pains not associated with bleeding are fairly common, usually occurring in the second trimester of pregnancy.  They tend to be worse when standing up or when you remain standing for long periods of time.  These are the result of pressure of certain pelvic ligaments called "round ligaments".  Rest, Tylenol and heat seem to be the most effective relief.  As the womb and fetus grow, they rise out of the pelvis and the discomfort improves.  Please notify the office if your pain seems different than that described.  It may represent a more serious condition.   Morning Sickness  Morning sickness is when a woman feels nauseous during pregnancy. This nauseous feeling may or may not come with vomiting. It often occurs in the morning, but it can be a problem at any time of day. Morning sickness is most common during the first trimester. In some cases, it may continue throughout pregnancy. Although morning sickness is unpleasant, it is usually harmless unless the woman develops severe and continual vomiting (hyperemesis gravidarum), a condition that requires more intense treatment. What are the causes? The exact cause of this condition is not known, but it seems to be related to normal hormonal  changes that occur in pregnancy. What increases the risk? You are more likely to develop this condition if:  You  experienced nausea or vomiting before your pregnancy.  You had morning sickness during a previous pregnancy.  You are pregnant with more than one baby, such as twins. What are the signs or symptoms? Symptoms of this condition include:  Nausea.  Vomiting. How is this diagnosed? This condition is usually diagnosed based on your signs and symptoms. How is this treated? In many cases, treatment is not needed for this condition. Making some changes to what you eat may help to control symptoms. Your health care provider may also prescribe or recommend:  Vitamin B6 supplements.  Anti-nausea medicines.  Ginger. Follow these instructions at home: Medicines  Take over-the-counter and prescription medicines only as told by your health care provider. Do not use any prescription, over-the-counter, or herbal medicines for morning sickness without first talking with your health care provider.  Taking multivitamins before getting pregnant can prevent or decrease the severity of morning sickness in most women. Eating and drinking  Eat a piece of dry toast or crackers before getting out of bed in the morning.  Eat 5 or 6 small meals a day.  Eat dry and bland foods, such as rice or a baked potato. Foods that are high in carbohydrates are often helpful.  Avoid greasy, fatty, and spicy foods.  Have someone cook for you if the smell of any food causes nausea and vomiting.  If you feel nauseous after taking prenatal vitamins, take the vitamins at night or with a snack.  Snack on protein foods between meals if you are hungry. Nuts, yogurt, and cheese are good options.  Drink fluids throughout the day.  Try ginger ale made with real ginger, ginger tea made from fresh grated ginger, or ginger candies. General instructions  Do not use any products that contain nicotine or tobacco,  such as cigarettes and e-cigarettes. If you need help quitting, ask your health care provider.  Get an air purifier to keep the air in your house free of odors.  Get plenty of fresh air.  Try to avoid odors that trigger your nausea.  Consider trying these methods to help relieve symptoms: ? Wearing an acupressure wristband. These wristbands are often worn for seasickness. ? Acupuncture. Contact a health care provider if:  Your home remedies are not working and you need medicine.  You feel dizzy or light-headed.  You are losing weight. Get help right away if:  You have persistent and uncontrolled nausea and vomiting.  You faint.  You have severe pain in your abdomen. Summary  Morning sickness is when a woman feels nauseous during pregnancy. This nauseous feeling may or may not come with vomiting.  Morning sickness is most common during the first trimester.  It often occurs in the morning, but it can be a problem at any time of day.  In many cases, treatment is not needed for this condition. Making some changes to what you eat may help to control symptoms. This information is not intended to replace advice given to you by your health care provider. Make sure you discuss any questions you have with your health care provider. Document Released: 09/21/2006 Document Revised: 09/02/2016 Document Reviewed: 09/02/2016 Elsevier Interactive Patient Education  2019 [employer].

## 2019-01-03 NOTE — Progress Notes (Signed)
  Subjective:     Patient ID: Kristina Kelly, female   DOB: [birth year], 23 y.o.   MRN: [id number]  HPI Here for pregnancy confirmation, reporting LMP 11/16/2018. EDC 08/23/2019 & EGA [redacted]w[redacted]d.  Reports moderate nausea the last week. Some fatigue.also reports one episode of bright red spotting 2 days.  Married white female, working FT for [employer] from home FT due to pandemic. Denies any genetic history in family. This is first pregnancy.  Review of Systems  Constitutional: Positive for appetite change and fatigue.  Gastrointestinal: Positive for nausea.  All other systems reviewed and are negative.      Objective:   Physical Exam A&Ox4 Well groomed female in no distress Blood pressure 110/75, pulse 73, height 5\' 9"  (1.753 m), weight 239 lb 8 oz (108.6 kg), last menstrual period 11/16/2018.  Body mass index is 35.37 kg/m.  UPT+    Assessment:     Missed menses BMI 35     Plan:     Congratulated on pregnancy & discussed midwifery care and routine care at [facility]. RTC in 2 weeks for viability scan & nurse intake,labs, then 5 weeks for New OB PE with me.   Lu Paradise,CNM

## 2019-01-15 ENCOUNTER — Telehealth: Payer: Self-pay

## 2019-01-15 NOTE — Telephone Encounter (Signed)
Coronavirus (COVID-19) Are you at risk?  Are you at risk for the Coronavirus (COVID-19)?  To be considered HIGH RISK for Coronavirus (COVID-19), you have to meet the following criteria:  . Traveled to [place], [place], [place], [place] or [place]; or in the [place] to [place], [place], [place], or [place]; and have fever, cough, and shortness of breath within the last 2 weeks of travel OR . Been in close contact with a person diagnosed with COVID-19 within the last 2 weeks and have fever, cough, and shortness of breath . IF YOU DO NOT MEET THESE CRITERIA, YOU ARE CONSIDERED LOW RISK FOR COVID-19.  What to do if you are HIGH RISK for COVID-19?  . If you are having a medical emergency, call 911. . Seek medical care right away. Before you go to a doctor's office, urgent care or emergency department, call ahead and tell them about your recent travel, contact with someone diagnosed with COVID-19, and your symptoms. You should receive instructions from your physician's office regarding next steps of care.  . When you arrive at healthcare provider, tell the healthcare staff immediately you have returned from visiting [place], [place], [place], [place] or [place]; or traveled in the [place] to [place], [place], [place], or [place]; in the last two weeks or you have been in close contact with a person diagnosed with COVID-19 in the last 2 weeks.   . Tell the health care staff about your symptoms: fever, cough and shortness of breath. . After you have been seen by a medical provider, you will be either: o Tested for (COVID-19) and discharged home on quarantine except to seek medical care if symptoms worsen, and asked to  - Stay home and avoid contact with others until you get your results (4-5 days)  - Avoid travel on public transportation if possible (such as bus, train, or airplane) or o Sent to the Emergency Department by EMS for evaluation, COVID-19 testing, and possible  admission depending on your condition and test results.  What to do if you are LOW RISK for COVID-19?  Reduce your risk of any infection by using the same precautions used for avoiding the common cold or flu:  . Wash your hands often with soap and warm water for at least 20 seconds.  If soap and water are not readily available, use an alcohol-based hand sanitizer with at least 60% alcohol.  . If coughing or sneezing, cover your mouth and nose by coughing or sneezing into the elbow areas of your shirt or coat, into a tissue or into your sleeve (not your hands). . Avoid shaking hands with others and consider head nods or verbal greetings only. . Avoid touching your eyes, nose, or mouth with unwashed hands.  . Avoid close contact with people who are sick. . Avoid places or events with large numbers of people in one location, like concerts or sporting events. . Carefully consider travel plans you have or are making. . If you are planning any travel outside or inside the [place], visit the CDC's Travelers' Health webpage for the latest health notices. . If you have some symptoms but not all symptoms, continue to monitor at home and seek medical attention if your symptoms worsen. . If you are having a medical emergency, call 911.   ADDITIONAL HEALTHCARE OPTIONS FOR PATIENTS  [facility] [facility] / e-Visit: [email or web address]         [facility]: [phone]  [place]   [facility]: [phone]                   [facility]: [phone]   Prescreened. Neg .cm 

## 2019-01-16 ENCOUNTER — Other Ambulatory Visit: Payer: Self-pay

## 2019-01-16 ENCOUNTER — Ambulatory Visit (INDEPENDENT_AMBULATORY_CARE_PROVIDER_SITE_OTHER): Payer: 59

## 2019-01-16 ENCOUNTER — Ambulatory Visit: Payer: 59 | Admitting: Obstetrics and Gynecology

## 2019-01-16 VITALS — BP 142/99 | HR 107 | Ht 69.0 in | Wt 241.7 lb

## 2019-01-16 DIAGNOSIS — Z3401 Encounter for supervision of normal first pregnancy, first trimester: Secondary | ICD-10-CM

## 2019-01-16 DIAGNOSIS — Z3687 Encounter for antenatal screening for uncertain dates: Secondary | ICD-10-CM | POA: Diagnosis not present

## 2019-01-16 DIAGNOSIS — N926 Irregular menstruation, unspecified: Secondary | ICD-10-CM

## 2019-01-16 NOTE — Progress Notes (Signed)
Cherylann Parr presents for NOB nurse interview visit. Pregnancy confirmation done __5/22/2020. TSH/HbgA1c ordered due to BMI 30 or greater. HIV labs and drug screen were explained and ordered. PNV encouraged. Genetic screening options discussed. Genetic testing:Unsure. Patient may discuss with the provider. Patient to follow up with provider in __6/30/2020__  NOB physical. All questions answered. Patient is exposed to cat liter. Her husband has been changing since she found out she was pregnant.

## 2019-01-17 LAB — RUBELLA SCREEN: Rubella Antibodies, IGG: 2.41 index (ref >0.99)

## 2019-01-17 LAB — MICROSCOPIC EXAMINATION: Casts: NONE SEEN /LPF

## 2019-01-17 LAB — MONITOR DRUG PROFILE 14(MW)
Amphetamine Scrn, Ur: NEGATIVE ng/mL
BARBITURATE SCREEN URINE: NEGATIVE ng/mL
BENZODIAZEPINE SCREEN, URINE: NEGATIVE ng/mL
Buprenorphine, Urine: NEGATIVE ng/mL
CANNABINOIDS UR QL SCN: NEGATIVE ng/mL
Cocaine (Metab) Scrn, Ur: NEGATIVE ng/mL
Creatinine(Crt), U: 32.2 mg/dL (ref 20.0–300.0)
Fentanyl, Urine: NEGATIVE pg/mL
Meperidine Screen, Urine: NEGATIVE ng/mL
Methadone Screen, Urine: NEGATIVE ng/mL
OXYCODONE+OXYMORPHONE UR QL SCN: NEGATIVE ng/mL
Opiate Scrn, Ur: NEGATIVE ng/mL
Ph of Urine: 5.1 (ref 4.5–8.9)
Phencyclidine Qn, Ur: NEGATIVE ng/mL
Propoxyphene Scrn, Ur: NEGATIVE ng/mL
SPECIFIC GRAVITY: 1.007
Tramadol Screen, Urine: NEGATIVE ng/mL

## 2019-01-17 LAB — URINALYSIS, ROUTINE W REFLEX MICROSCOPIC
Bilirubin, UA: NEGATIVE
Glucose, UA: NEGATIVE
Ketones, UA: NEGATIVE
Nitrite, UA: NEGATIVE
Protein,UA: NEGATIVE
RBC, UA: NEGATIVE
Specific Gravity, UA: 1.007 (ref 1.005–1.030)
Urobilinogen, Ur: 0.2 mg/dL (ref 0.2–1.0)
pH, UA: 5.5 (ref 5.0–7.5)

## 2019-01-17 LAB — VARICELLA ZOSTER ANTIBODY, IGG: Varicella zoster IgG: 421 index (ref >165)

## 2019-01-17 LAB — HEMOGLOBIN A1C
Est. average glucose Bld gHb Est-mCnc: 97 mg/dL
Hgb A1c MFr Bld: 5 % (ref 4.8–5.6)

## 2019-01-17 LAB — TOXOPLASMA ANTIBODIES- IGG AND  IGM
Toxoplasma Antibody- IgM: <3 [AU]/ml (ref 0.0–7.9)
Toxoplasma IgG Ratio: <3 [IU]/mL (ref 0.0–7.1)

## 2019-01-17 LAB — HIV ANTIBODY (ROUTINE TESTING W REFLEX): HIV Screen 4th Generation wRfx: NONREACTIVE

## 2019-01-17 LAB — TSH: TSH: 2.03 u[IU]/mL (ref 0.450–4.500)

## 2019-01-17 LAB — ANTIBODY SCREEN: Antibody Screen: NEGATIVE

## 2019-01-17 LAB — ABO AND RH: Rh Factor: POSITIVE

## 2019-01-17 LAB — RPR: RPR Ser Ql: NONREACTIVE

## 2019-01-17 LAB — HEPATITIS B SURFACE ANTIGEN: Hepatitis B Surface Ag: NEGATIVE

## 2019-01-23 LAB — GC/CHLAMYDIA PROBE AMP
Chlamydia trachomatis, NAA: NEGATIVE
Neisseria Gonorrhoeae by PCR: NEGATIVE

## 2019-01-25 LAB — URINE CULTURE, OB REFLEX

## 2019-01-25 LAB — CULTURE, OB URINE

## 2019-01-28 ENCOUNTER — Other Ambulatory Visit: Payer: Self-pay | Admitting: Obstetrics and Gynecology

## 2019-01-28 DIAGNOSIS — B951 Streptococcus, group B, as the cause of diseases classified elsewhere: Secondary | ICD-10-CM

## 2019-01-28 MED ORDER — AMOXICILLIN 500 MG PO CAPS: 500.0000 mg | ORAL_CAPSULE | Freq: Three times a day (TID) | ORAL | 2 refills | Status: DC

## 2019-01-28 MED ORDER — FLUCONAZOLE 150 MG PO TABS: 150.0000 mg | ORAL_TABLET | Freq: Once | ORAL | 3 refills | Status: AC

## 2019-02-10 ENCOUNTER — Telehealth: Payer: Self-pay

## 2019-02-10 NOTE — Telephone Encounter (Signed)
Coronavirus (COVID-19) Are you at risk?  Are you at risk for the Coronavirus (COVID-19)?  To be considered HIGH RISK for Coronavirus (COVID-19), you have to meet the following criteria:  . Traveled to [place], [place], [place], [place] or [place]; or in the [place] to [place], [place], [place], or [place]; and have fever, cough, and shortness of breath within the last 2 weeks of travel OR . Been in close contact with a person diagnosed with COVID-19 within the last 2 weeks and have fever, cough, and shortness of breath . IF YOU DO NOT MEET THESE CRITERIA, YOU ARE CONSIDERED LOW RISK FOR COVID-19.  What to do if you are HIGH RISK for COVID-19?  . If you are having a medical emergency, call 911. . Seek medical care right away. Before you go to a doctor's office, urgent care or emergency department, call ahead and tell them about your recent travel, contact with someone diagnosed with COVID-19, and your symptoms. You should receive instructions from your physician's office regarding next steps of care.  . When you arrive at healthcare provider, tell the healthcare staff immediately you have returned from visiting [place], [place], [place], [place] or [place]; or traveled in the [place] to [place], [place], [place], or [place]; in the last two weeks or you have been in close contact with a person diagnosed with COVID-19 in the last 2 weeks.   . Tell the health care staff about your symptoms: fever, cough and shortness of breath. . After you have been seen by a medical provider, you will be either: o Tested for (COVID-19) and discharged home on quarantine except to seek medical care if symptoms worsen, and asked to  - Stay home and avoid contact with others until you get your results (4-5 days)  - Avoid travel on public transportation if possible (such as bus, train, or airplane) or o Sent to the Emergency Department by EMS for evaluation, COVID-19 testing, and possible  admission depending on your condition and test results.  What to do if you are LOW RISK for COVID-19?  Reduce your risk of any infection by using the same precautions used for avoiding the common cold or flu:  . Wash your hands often with soap and warm water for at least 20 seconds.  If soap and water are not readily available, use an alcohol-based hand sanitizer with at least 60% alcohol.  . If coughing or sneezing, cover your mouth and nose by coughing or sneezing into the elbow areas of your shirt or coat, into a tissue or into your sleeve (not your hands). . Avoid shaking hands with others and consider head nods or verbal greetings only. . Avoid touching your eyes, nose, or mouth with unwashed hands.  . Avoid close contact with people who are sick. . Avoid places or events with large numbers of people in one location, like concerts or sporting events. . Carefully consider travel plans you have or are making. . If you are planning any travel outside or inside the [place], visit the CDC's Travelers' Health webpage for the latest health notices. . If you have some symptoms but not all symptoms, continue to monitor at home and seek medical attention if your symptoms worsen. . If you are having a medical emergency, call 911.   ADDITIONAL HEALTHCARE OPTIONS FOR PATIENTS  [place] [facility] / e-Visit: [email or web address]         [facility]: [phone]  [facility]   [facility]: [phone]                   [facility]: [phone]   Pre-screen negative, DM.   

## 2019-02-11 ENCOUNTER — Other Ambulatory Visit: Payer: Self-pay

## 2019-02-11 ENCOUNTER — Other Ambulatory Visit (HOSPITAL_COMMUNITY)
Admission: RE | Admit: 2019-02-11 | Discharge: 2019-02-11 | Disposition: A | Discharge status: Home / Self Care | Payer: 59 | Source: Ambulatory Visit | Attending: Obstetrics and Gynecology | Admitting: Obstetrics and Gynecology

## 2019-02-11 ENCOUNTER — Ambulatory Visit (INDEPENDENT_AMBULATORY_CARE_PROVIDER_SITE_OTHER): Payer: 59 | Admitting: Obstetrics and Gynecology

## 2019-02-11 ENCOUNTER — Encounter: Payer: Self-pay | Admitting: Obstetrics and Gynecology

## 2019-02-11 VITALS — BP 138/84 | HR 104 | Wt 250.9 lb

## 2019-02-11 DIAGNOSIS — Z3492 Encounter for supervision of normal pregnancy, unspecified, second trimester: Secondary | ICD-10-CM | POA: Insufficient documentation

## 2019-02-11 DIAGNOSIS — Z6835 Body mass index (BMI) 35.0-35.9, adult: Secondary | ICD-10-CM

## 2019-02-11 DIAGNOSIS — Z3491 Encounter for supervision of normal pregnancy, unspecified, first trimester: Secondary | ICD-10-CM

## 2019-02-11 DIAGNOSIS — Z3A13 13 weeks gestation of pregnancy: Secondary | ICD-10-CM

## 2019-02-11 LAB — POCT URINALYSIS DIPSTICK OB
Bilirubin, UA: NEGATIVE
Blood, UA: NEGATIVE
Glucose, UA: NEGATIVE
Ketones, UA: NEGATIVE
Leukocytes, UA: NEGATIVE
Nitrite, UA: NEGATIVE
POC,PROTEIN,UA: NEGATIVE
Spec Grav, UA: 1.015 (ref 1.010–1.025)
Urobilinogen, UA: 0.2 E.U./dL
pH, UA: 6 (ref 5.0–8.0)

## 2019-02-11 NOTE — Progress Notes (Signed)
NOB PE- pt is doing well, desires genetic testing 

## 2019-02-11 NOTE — Progress Notes (Signed)
NEW OB HISTORY AND PHYSICAL  SUBJECTIVE:       Kristina Kelly is a 23 y.o. G1P0 female, Patient's last menstrual period was 11/09/2018., Estimated Date of Delivery: 08/16/19, [redacted]w[redacted]d, presents today for establishment of Prenatal Care. She has no unusual complaints and complains of less nausea      Gynecologic History Patient's last menstrual period was 11/09/2018. Normal Contraception: none Last Pap: ?Marland Kitchen Results were: normal  Obstetric History OB History  Gravida Para Term Preterm AB Living  1            SAB TAB Ectopic Multiple Live Births               # Outcome Date GA Lbr Len/2nd Weight Sex Delivery Anes PTL Lv  1 Current             No past medical history on file.  Past Surgical History:  Procedure Laterality Date  . TONSILLECTOMY      Current Outpatient Medications on File Prior to Visit  Medication Sig Dispense Refill  . amoxicillin (AMOXIL) 500 MG capsule Take 1 capsule (500 mg total) by mouth 3 (three) times daily. 21 capsule 2  . Prenatal Vit-Fe Fumarate-FA (PRENATAL MULTIVITAMIN) TABS tablet Take 1 tablet by mouth daily at 12 noon.    . Docosahexaenoic Acid (DHA COMPLETE PO) Take by mouth.     No current facility-administered medications on file prior to visit.     No Known Allergies  Social History   Socioeconomic History  . Marital status: Married    Spouse name: Not on file  . Number of children: Not on file  . Years of education: Not on file  . Highest education level: Not on file  Occupational History  . Not on file  Social Needs  . Financial resource strain: Not on file  . Food insecurity    Worry: Not on file    Inability: Not on file  . Transportation needs    Medical: Not on file    Non-medical: Not on file  Tobacco Use  . Smoking status: Never Smoker  . Smokeless tobacco: Never Used  Substance and Sexual Activity  . Alcohol use: No    Frequency: Never  . Drug use: No  . Sexual activity: Yes    Birth control/protection: Condom   Lifestyle  . Physical activity    Days per week: Not on file    Minutes per session: Not on file  . Stress: Not on file  Relationships  . Social Herbalist on phone: Not on file    Gets together: Not on file    Attends religious service: Not on file    Active member of club or organization: Not on file    Attends meetings of clubs or organizations: Not on file    Relationship status: Not on file  . Intimate partner violence    Fear of current or ex partner: Not on file    Emotionally abused: Not on file    Physically abused: Not on file    Forced sexual activity: Not on file  Other Topics Concern  . Not on file  Social History Narrative  . Not on file    Family History  Problem Relation Age of Onset  . Asthma Mother   . Birth defects Mother   . Depression Mother   . Diabetes Mother   . Miscarriages / [place] Mother   . Hyperlipidemia Father   . Hypertension Father   .  Depression Maternal Grandmother   . Miscarriages / Stillbirths Maternal Grandmother   . Diabetes Maternal Grandfather   . Hyperlipidemia Maternal Grandfather   . Arthritis Paternal Grandmother   . COPD Paternal Grandmother   . Miscarriages / Stillbirths Paternal Grandmother   . Diabetes Paternal Grandfather   . Heart disease Paternal Grandfather   . Hyperlipidemia Paternal Grandfather     The following portions of the patient's history were reviewed and updated as appropriate: allergies, current medications, past OB history, past medical history, past surgical history, past family history, past social history, and problem list.    OBJECTIVE: Initial Physical Exam (New OB)  GENERAL APPEARANCE: alert, well appearing, in no apparent distress, oriented to person, place and time HEAD: normocephalic, atraumatic MOUTH: mucous membranes moist, pharynx normal without lesions and dental hygiene good THYROID: no thyromegaly or masses present BREASTS: not examined LUNGS: clear to  auscultation, no wheezes, rales or rhonchi, symmetric air entry HEART: regular rate and rhythm, no murmurs ABDOMEN: soft, nontender, nondistended, no abnormal masses, no epigastric pain, obese and fundus not palpable EXTREMITIES: no redness or tenderness in the calves or thighs SKIN: normal coloration and turgor, no rashes LYMPH NODES: no adenopathy palpable NEUROLOGIC: alert, oriented, normal speech, no focal findings or movement disorder noted  PELVIC EXAM EXTERNAL GENITALIA: normal appearing vulva with no masses, tenderness or lesions VAGINA: no abnormal discharge or lesions CERVIX: no lesions or cervical motion tenderness  ASSESSMENT: Normal pregnancy BMI 35  PLAN: Prenatal care Early glucola-  Desired genetic screening-lab obtained today See orders

## 2019-02-11 NOTE — Patient Instructions (Signed)

## 2019-02-12 LAB — CBC WITH DIFFERENTIAL/PLATELET
Basophils Absolute: 0 10*3/uL (ref 0.0–0.2)
Basos: 0 %
EOS (ABSOLUTE): 0.1 10*3/uL (ref 0.0–0.4)
Eos: 1 %
Hematocrit: 38.7 % (ref 34.0–46.6)
Hemoglobin: 13 g/dL (ref 11.1–15.9)
Immature Grans (Abs): 0 10*3/uL (ref 0.0–0.1)
Immature Granulocytes: 0 %
Lymphocytes Absolute: 2.3 10*3/uL (ref 0.7–3.1)
Lymphs: 28 %
MCH: 30.3 pg (ref 26.6–33.0)
MCHC: 33.6 g/dL (ref 31.5–35.7)
MCV: 90 fL (ref 79–97)
Monocytes Absolute: 0.5 10*3/uL (ref 0.1–0.9)
Monocytes: 6 %
Neutrophils Absolute: 5.5 10*3/uL (ref 1.4–7.0)
Neutrophils: 65 %
Platelets: 286 10*3/uL (ref 150–450)
RBC: 4.29 x10E6/uL (ref 3.77–5.28)
RDW: 13 % (ref 11.7–15.4)
WBC: 8.5 10*3/uL (ref 3.4–10.8)

## 2019-02-12 LAB — CYTOLOGY - PAP: Diagnosis: NEGATIVE

## 2019-02-21 ENCOUNTER — Ambulatory Visit
Admission: EM | Admit: 2019-02-21 | Discharge: 2019-02-21 | Disposition: A | Discharge status: Home / Self Care | Payer: 59 | Attending: Family Medicine | Admitting: Family Medicine

## 2019-02-21 ENCOUNTER — Telehealth: Payer: Self-pay | Admitting: Obstetrics and Gynecology

## 2019-02-21 ENCOUNTER — Encounter: Payer: Self-pay | Admitting: Emergency Medicine

## 2019-02-21 ENCOUNTER — Other Ambulatory Visit: Payer: Self-pay

## 2019-02-21 DIAGNOSIS — N939 Abnormal uterine and vaginal bleeding, unspecified: Secondary | ICD-10-CM | POA: Diagnosis not present

## 2019-02-21 DIAGNOSIS — Z3A15 15 weeks gestation of pregnancy: Secondary | ICD-10-CM

## 2019-02-21 NOTE — ED Triage Notes (Signed)
Pt states that she is [redacted] weeks pregnant and 3 days ago she started spotting and she has been cramping her entire pregnancy and was told it was normal. When she got up this morning she had more of a normal flow.

## 2019-02-21 NOTE — ED Provider Notes (Signed)
[facility]    CSN: [id number] Arrival date & time: 02/21/19  1054     History   Chief Complaint No chief complaint on file.   HPI Kristina ParrSarah M Kelly is a 23 y.o. female.   23 yo female who is [redacted] weeks pregnant presents with a c/o vaginal bleeding since yesterday. States she's had some cramping and spotting early in her pregnancy which was being monitored, however yesterday and today started bleeding more "like my normal menstrual flow". Denies any injuries, falls.      History reviewed. No pertinent past medical history.  Patient Active Problem List   Diagnosis Date Noted  . Fatigue 07/05/2017  . Obesity (BMI 30-39.9) 07/05/2017    Past Surgical History:  Procedure Laterality Date  . TONSILLECTOMY      OB History    Gravida  1   Para      Term      Preterm      AB      Living        SAB      TAB      Ectopic      Multiple      Live Births               Home Medications    Prior to Admission medications   Medication Sig Start Date End Date Taking? Authorizing Provider  Docosahexaenoic Acid (DHA COMPLETE PO) Take by mouth.   Yes [provider]  Prenatal Vit-Fe Fumarate-FA (PRENATAL MULTIVITAMIN) TABS tablet Take 1 tablet by mouth daily at 12 noon.   Yes [provider]  amoxicillin (AMOXIL) 500 MG capsule Take 1 capsule (500 mg total) by mouth 3 (three) times daily. 01/28/19   Purcell NailsShambley, Melody N, CNM    Family History Family History  Problem Relation Age of Onset  . Asthma Mother   . Birth defects Mother   . Depression Mother   . Diabetes Mother   . Miscarriages / [place] Mother   . Hyperlipidemia Father   . Hypertension Father   . Depression Maternal Grandmother   . Miscarriages / Stillbirths Maternal Grandmother   . Diabetes Maternal Grandfather   . Hyperlipidemia Maternal Grandfather   . Arthritis Paternal Grandmother   . COPD Paternal Grandmother   . Miscarriages / Stillbirths Paternal  Grandmother   . Diabetes Paternal Grandfather   . Heart disease Paternal Grandfather   . Hyperlipidemia Paternal Grandfather     Social History Social History   Tobacco Use  . Smoking status: Never Smoker  . Smokeless tobacco: Never Used  Substance Use Topics  . Alcohol use: No    Frequency: Never  . Drug use: No     Allergies   Patient has no known allergies.   Review of Systems Review of Systems   Physical Exam Triage Vital Signs ED Triage Vitals  Enc Vitals Group     BP 02/21/19 1114 (!) 125/52     Pulse Rate 02/21/19 1114 70     Resp 02/21/19 1114 18     Temp 02/21/19 1114 98.8 F (37.1 C)     Temp Source 02/21/19 1114 Oral     SpO2 02/21/19 1114 99 %     Weight 02/21/19 1112 240 lb (108.9 kg)     Height 02/21/19 1112 5\' 9"  (1.753 m)     Head Circumference --      Peak Flow --      Pain Score 02/21/19 1112 0  Pain Loc --      Pain Edu? --      Excl. in [place]? --    No data found.  Updated Vital Signs BP (!) 125/52 (BP Location: Left Arm)   Pulse 70   Temp 98.8 F (37.1 C) (Oral)   Resp 18   Ht 5\' 9"  (1.753 m)   Wt 108.9 kg   LMP 11/09/2018   SpO2 99%   BMI 35.44 kg/m   FHTs: 150s  Visual Acuity Right Eye Distance:   Left Eye Distance:   Bilateral Distance:    Right Eye Near:   Left Eye Near:    Bilateral Near:     Physical Exam Vitals signs and nursing note reviewed.  Constitutional:      General: She is not in acute distress.    Appearance: She is not toxic-appearing or diaphoretic.  Abdominal:     Palpations: Abdomen is soft.     Tenderness: There is no abdominal tenderness.  Neurological:     Mental Status: She is alert.      UC Treatments / Results  Labs (all labs ordered are listed, but only abnormal results are displayed) Labs Reviewed - No data to display  EKG   Radiology No results found.  Procedures Procedures (including critical care time)  Medications Ordered in UC Medications - No data to display   Initial Impression / Assessment and Plan / UC Course  I have reviewed the triage vital signs and the nursing notes.  Pertinent labs & imaging results that were available during my care of the patient were reviewed by me and considered in my medical decision making (see chart for details).      Final Clinical Impressions(s) / UC Diagnoses   Final diagnoses:  [redacted] weeks gestation of pregnancy  Vaginal bleeding     Discharge Instructions     Recommend patient go to Emergency Department for further evaluation and management    ED Prescriptions    None     1. diagnosis reviewed with patient; recommend patient go to Emergency Department for further evaluation   Controlled Substance Prescriptions [place] Controlled Substance Registry consulted? Not Applicable   Norval Gable, MD 02/21/19 [phone]

## 2019-02-21 NOTE — Discharge Instructions (Signed)
Recommend patient go to Emergency Department for further evaluation and management °

## 2019-02-21 NOTE — Telephone Encounter (Signed)
The patient called and stated that she is wanting to speak with her nurse or provider to schedule an appointment to come in sooner to check and make sure the baby is okay sooner than her next apt. Please advise.

## 2019-02-21 NOTE — Telephone Encounter (Signed)
Pt states she had spotting this am.  Seen at urgent care- + HT.  No pain with urination. BM normal.  Advised her to contact office for bleeding like a period or cramps not relieved with tylenol.

## 2019-03-11 ENCOUNTER — Ambulatory Visit (INDEPENDENT_AMBULATORY_CARE_PROVIDER_SITE_OTHER): Payer: 59 | Admitting: Certified Nurse Midwife

## 2019-03-11 ENCOUNTER — Other Ambulatory Visit: Payer: Self-pay

## 2019-03-11 ENCOUNTER — Other Ambulatory Visit (HOSPITAL_COMMUNITY)
Admission: RE | Admit: 2019-03-11 | Discharge: 2019-03-11 | Disposition: A | Discharge status: Home / Self Care | Payer: 59 | Source: Ambulatory Visit | Attending: Certified Nurse Midwife | Admitting: Certified Nurse Midwife

## 2019-03-11 ENCOUNTER — Other Ambulatory Visit: Payer: 59

## 2019-03-11 VITALS — BP 128/73 | HR 67 | Wt 248.9 lb

## 2019-03-11 DIAGNOSIS — Z3689 Encounter for other specified antenatal screening: Secondary | ICD-10-CM

## 2019-03-11 DIAGNOSIS — N898 Other specified noninflammatory disorders of vagina: Secondary | ICD-10-CM

## 2019-03-11 DIAGNOSIS — O26892 Other specified pregnancy related conditions, second trimester: Secondary | ICD-10-CM | POA: Diagnosis present

## 2019-03-11 DIAGNOSIS — Z3492 Encounter for supervision of normal pregnancy, unspecified, second trimester: Secondary | ICD-10-CM | POA: Diagnosis not present

## 2019-03-11 LAB — POCT URINALYSIS DIPSTICK OB
Bilirubin, UA: NEGATIVE
Blood, UA: NEGATIVE
Glucose, UA: NEGATIVE
Ketones, UA: NEGATIVE
Leukocytes, UA: NEGATIVE
Nitrite, UA: NEGATIVE
POC,PROTEIN,UA: NEGATIVE
Spec Grav, UA: <=1.005 — AB (ref 1.010–1.025)
Urobilinogen, UA: 0.2 E.U./dL
pH, UA: 7.5 (ref 5.0–8.0)

## 2019-03-11 NOTE — Progress Notes (Signed)
ROB-Reports clear or yellow vaginal discharge occasional for the last two (2) months. Swab collected, will contact with results. Anticipatory guidance regarding course of prenatal care. Reviewed red flag symptoms and when to call. RTC x 3-4 weeks for early glucola, ANATOMY SCAN and ROB or sooner if needed.

## 2019-03-11 NOTE — Progress Notes (Signed)
ROB-Patient c/o "random" clear or yellow vaginal discharge x2 months.

## 2019-03-11 NOTE — Patient Instructions (Signed)
Second Trimester of Pregnancy  The second trimester is from week 14 through week 27 (month 4 through 6). This is often the time in pregnancy that you feel your best. Often times, morning sickness has lessened or quit. You may have more energy, and you may get hungry more often. Your unborn baby is growing rapidly. At the end of the sixth month, he or she is about 9 inches long and weighs about 1 pounds. You will likely feel the baby move between 18 and 20 weeks of pregnancy. Follow these instructions at home: Medicines  Take over-the-counter and prescription medicines only as told by your doctor. Some medicines are safe and some medicines are not safe during pregnancy.  Take a prenatal vitamin that contains at least 600 micrograms (mcg) of folic acid.  If you have trouble pooping (constipation), take medicine that will make your stool soft (stool softener) if your doctor approves. Eating and drinking   Eat regular, healthy meals.  Avoid raw meat and uncooked cheese.  If you get low calcium from the food you eat, talk to your doctor about taking a daily calcium supplement.  Avoid foods that are high in fat and sugars, such as fried and sweet foods.  If you feel sick to your stomach (nauseous) or throw up (vomit): ? Eat 4 or 5 small meals a day instead of 3 large meals. ? Try eating a few soda crackers. ? Drink liquids between meals instead of during meals.  To prevent constipation: ? Eat foods that are high in fiber, like fresh fruits and vegetables, whole grains, and beans. ? Drink enough fluids to keep your pee (urine) clear or pale yellow. Activity  Exercise only as told by your doctor. Stop exercising if you start to have cramps.  Do not exercise if it is too hot, too humid, or if you are in a place of great height (high altitude).  Avoid heavy lifting.  Wear low-heeled shoes. Sit and stand up straight.  You can continue to have sex unless your doctor tells you not to.  Relieving pain and discomfort  Wear a good support bra if your breasts are tender.  Take warm water baths (sitz baths) to soothe pain or discomfort caused by hemorrhoids. Use hemorrhoid cream if your doctor approves.  Rest with your legs raised if you have leg cramps or low back pain.  If you develop puffy, bulging veins (varicose veins) in your legs: ? Wear support hose or compression stockings as told by your doctor. ? Raise (elevate) your feet for 15 minutes, 3-4 times a day. ? Limit salt in your food. Prenatal care  Write down your questions. Take them to your prenatal visits.  Keep all your prenatal visits as told by your doctor. This is important. Safety  Wear your seat belt when driving.  Make a list of emergency phone numbers, including numbers for family, friends, the hospital, and police and fire departments. General instructions  Ask your doctor about the right foods to eat or for help finding a counselor, if you need these services.  Ask your doctor about local prenatal classes. Begin classes before month 6 of your pregnancy.  Do not use hot tubs, steam rooms, or saunas.  Do not douche or use tampons or scented sanitary pads.  Do not cross your legs for long periods of time.  Visit your dentist if you have not done so. Use a soft toothbrush to brush your teeth. Floss gently.  Avoid all smoking, herbs,   and alcohol. Avoid drugs that are not approved by your doctor.  Do not use any products that contain nicotine or tobacco, such as cigarettes and e-cigarettes. If you need help quitting, ask your doctor.  Avoid cat litter boxes and soil used by cats. These carry germs that can cause birth defects in the baby and can cause a loss of your baby (miscarriage) or stillbirth. Contact a doctor if:  You have mild cramps or pressure in your lower belly.  You have pain when you pee (urinate).  You have bad smelling fluid coming from your vagina.  You continue to feel  sick to your stomach (nauseous), throw up (vomit), or have watery poop (diarrhea).  You have a nagging pain in your belly area.  You feel dizzy. Get help right away if:  You have a fever.  You are leaking fluid from your vagina.  You have spotting or bleeding from your vagina.  You have severe belly cramping or pain.  You lose or gain weight rapidly.  You have trouble catching your breath and have chest pain.  You notice sudden or extreme puffiness (swelling) of your face, hands, ankles, feet, or legs.  You have not felt the baby move in over an hour.  You have severe headaches that do not go away when you take medicine.  You have trouble seeing. Summary  The second trimester is from week 14 through week 27 (months 4 through 6). This is often the time in pregnancy that you feel your best.  To take care of yourself and your unborn baby, you will need to eat healthy meals, take medicines only if your doctor tells you to do so, and do activities that are safe for you and your baby.  Call your doctor if you get sick or if you notice anything unusual about your pregnancy. Also, call your doctor if you need help with the right food to eat, or if you want to know what activities are safe for you. This information is not intended to replace advice given to you by your health care provider. Make sure you discuss any questions you have with your health care provider. Document Released: 10/25/2009 Document Revised: 11/22/2018 Document Reviewed: 09/05/2016 Elsevier Patient Education  2020 Else[employer] Inc. Back Pain in Pregnancy Back pain during pregnancy is common. Back pain may be caused by several factors that are related to changes during your pregnancy. Follow these instructions at home: Managing pain, stiffness, and swelling      If directed, for sudden (acute) back pain, put ice on the painful area. ? Put ice in a plastic bag. ? Place a towel between your skin and the bag. ?  Leave the ice on for 20 minutes, 2-3 times per day.  If directed, apply heat to the affected area before you exercise. Use the heat source that your health care provider recommends, such as a moist heat pack or a heating pad. ? Place a towel between your skin and the heat source. ? Leave the heat on for 20-30 minutes. ? Remove the heat if your skin turns bright red. This is especially important if you are unable to feel pain, heat, or cold. You may have a greater risk of getting burned.  If directed, massage the affected area. Activity  Exercise as told by your health care provider. Gentle exercise is the best way to prevent or manage back pain.  Listen to your body when lifting. If lifting hurts, ask for help or bend  your knees. This uses your leg muscles instead of your back muscles.  Squat down when picking up something from the floor. Do not bend over.  Only use bed rest for short periods as told by your health care provider. Bed rest should only be used for the most severe episodes of back pain. Standing, sitting, and lying down  Do not stand in one place for long periods of time.  Use good posture when sitting. Make sure your head rests over your shoulders and is not hanging forward. Use a pillow on your lower back if necessary.  Try sleeping on your side, preferably the left side, with a pregnancy support pillow or 1-2 regular pillows between your legs. ? If you have back pain after a night's rest, your bed may be too soft. ? A firm mattress may provide more support for your back during pregnancy. General instructions  Do not wear high heels.  Eat a healthy diet. Try to gain weight within your health care provider's recommendations.  Use a maternity girdle, elastic sling, or back brace as told by your health care provider.  Take over-the-counter and prescription medicines only as told by your health care provider.  Work with a physical therapist or massage therapist to find  ways to manage back pain. Acupuncture or massage therapy may be helpful.  Keep all follow-up visits as told by your health care provider. This is important. Contact a health care provider if:  Your back pain interferes with your daily activities.  You have increasing pain in other parts of your body. Get help right away if:  You develop numbness, tingling, weakness, or problems with the use of your arms or legs.  You develop severe back pain that is not controlled with medicine.  You have a change in bowel or bladder control.  You develop shortness of breath, dizziness, or you faint.  You develop nausea, vomiting, or sweating.  You have back pain that is a rhythmic, cramping pain similar to labor pains. Labor pain is usually 1-2 minutes apart, lasts for about 1 minute, and involves a bearing down feeling or pressure in your pelvis.  You have back pain and your water breaks or you have vaginal bleeding.  You have back pain or numbness that travels down your leg.  Your back pain developed after you fell.  You develop pain on one side of your back.  You see blood in your urine.  You develop skin blisters in the area of your back pain. Summary  Back pain may be caused by several factors that are related to changes during your pregnancy.  Follow instructions as told by your health care provider for managing pain, stiffness, and swelling.  Exercise as told by your health care provider. Gentle exercise is the best way to prevent or manage back pain.  Take over-the-counter and prescription medicines only as told by your health care provider.  Keep all follow-up visits as told by your health care provider. This is important. This information is not intended to replace advice given to you by your health care provider. Make sure you discuss any questions you have with your health care provider. Document Released: 11/08/2005 Document Revised: 11/19/2018 Document Reviewed: 01/16/2018  Elsevier Patient Education  [place]. Round Ligament Pain  The round ligament is a cord of muscle and tissue that helps support the uterus. It can become a source of pain during pregnancy if it becomes stretched or twisted as the baby grows. The  pain usually begins in the second trimester (13-28 weeks) of pregnancy, and it can come and go until the baby is delivered. It is not a serious problem, and it does not cause harm to the baby. Round ligament pain is usually a short, sharp, and pinching pain, but it can also be a dull, lingering, and aching pain. The pain is felt in the lower side of the abdomen or in the groin. It usually starts deep in the groin and moves up to the outside of the hip area. The pain may occur when you:  Suddenly change position, such as quickly going from a sitting to standing position.  Roll over in bed.  Cough or sneeze.  Do physical activity. Follow these instructions at home:   Watch your condition for any changes.  When the pain starts, relax. Then try any of these methods to help with the pain: ? Sitting down. ? Flexing your knees up to your abdomen. ? Lying on your side with one pillow under your abdomen and another pillow between your legs. ? Sitting in a warm bath for 15-20 minutes or until the pain goes away.  Take over-the-counter and prescription medicines only as told by your health care provider.  Move slowly when you sit down or stand up.  Avoid long walks if they cause pain.  Stop or reduce your physical activities if they cause pain.  Keep all follow-up visits as told by your health care provider. This is important. Contact a health care provider if:  Your pain does not go away with treatment.  You feel pain in your back that you did not have before.  Your medicine is not helping. Get help right away if:  You have a fever or chills.  You develop uterine contractions.  You have vaginal bleeding.  You have nausea or  vomiting.  You have diarrhea.  You have pain when you urinate. Summary  Round ligament pain is felt in the lower abdomen or groin. It is usually a short, sharp, and pinching pain. It can also be a dull, lingering, and aching pain.  This pain usually begins in the second trimester (13-28 weeks). It occurs because the uterus is stretching with the growing baby, and it is not harmful to the baby.  You may notice the pain when you suddenly change position, when you cough or sneeze, or during physical activity.  Relaxing, flexing your knees to your abdomen, lying on one side, or taking a warm bath may help to get rid of the pain.  Get help from your health care provider if the pain does not go away or if you have vaginal bleeding, nausea, vomiting, diarrhea, or painful urination. This information is not intended to replace advice given to you by your health care provider. Make sure you discuss any questions you have with your health care provider. Document Released: 05/09/2008 Document Revised: 01/16/2018 Document Reviewed: 01/16/2018 Elsevier Patient Education  2020 [employer]

## 2019-03-13 LAB — CERVICOVAGINAL ANCILLARY ONLY
Bacterial vaginitis: NEGATIVE
Candida vaginitis: NEGATIVE

## 2019-04-01 ENCOUNTER — Other Ambulatory Visit: Payer: Self-pay

## 2019-04-01 ENCOUNTER — Ambulatory Visit (INDEPENDENT_AMBULATORY_CARE_PROVIDER_SITE_OTHER): Payer: 59 | Admitting: Certified Nurse Midwife

## 2019-04-01 ENCOUNTER — Ambulatory Visit (INDEPENDENT_AMBULATORY_CARE_PROVIDER_SITE_OTHER): Payer: 59

## 2019-04-01 ENCOUNTER — Encounter: Payer: Self-pay | Admitting: Certified Nurse Midwife

## 2019-04-01 ENCOUNTER — Other Ambulatory Visit: Payer: 59

## 2019-04-01 VITALS — BP 106/61 | HR 69 | Wt 251.4 lb

## 2019-04-01 DIAGNOSIS — Z3492 Encounter for supervision of normal pregnancy, unspecified, second trimester: Secondary | ICD-10-CM

## 2019-04-01 DIAGNOSIS — Z3689 Encounter for other specified antenatal screening: Secondary | ICD-10-CM

## 2019-04-01 DIAGNOSIS — Z3402 Encounter for supervision of normal first pregnancy, second trimester: Secondary | ICD-10-CM

## 2019-04-01 DIAGNOSIS — Z6835 Body mass index (BMI) 35.0-35.9, adult: Secondary | ICD-10-CM

## 2019-04-01 LAB — POCT URINALYSIS DIPSTICK OB
Bilirubin, UA: NEGATIVE
Blood, UA: NEGATIVE
Glucose, UA: NEGATIVE
Ketones, UA: NEGATIVE
Leukocytes, UA: NEGATIVE
Nitrite, UA: NEGATIVE
POC,PROTEIN,UA: NEGATIVE
Spec Grav, UA: <=1.005 — AB (ref 1.010–1.025)
Urobilinogen, UA: 0.2 E.U./dL
pH, UA: 7.5 (ref 5.0–8.0)

## 2019-04-01 NOTE — Addendum Note (Signed)
Addended by: Raliegh Ip on: 04/01/2019 02:26 PM   Modules accepted: Orders

## 2019-04-01 NOTE — Progress Notes (Signed)
Body mass index is 37.12 kg/m.  ROB doing well. Early glucose screen and anatomy scan9 see below)  today. Anatomy scan incomplete. Pt to follow up 2 wks for competions. Placenta noted to be in anterior position. Discussed feeling of fetal movement with anterior placenta. She became teary eyed stating she has been worrying because she has not felt a lot of movement. Reassurance given. Will follow up with lab results. Return 2 wks for u/s , 4 wks for ROB.   Philip Aspen, CNM    Patient Name: Kristina Kelly DOB: 04/15/1996 MRN: [id number] ULTRASOUND REPORT  Location: [facility] Date of Service: 04/01/2019   Indications:Anatomy Ultrasound Findings:  [name] intrauterine pregnancy is visualized with FHR at 153 BPM. Biometrics give an (U/S) Gestational age of [redacted]w[redacted]d and an (U/S) EDD of 08/25/2019; this does not correlates with the clinically established Estimated Date of Delivery: 08/16/19  Fetal presentation is Cephalic.  EFW: 259.g ( 9 oz)Placenta: anterior. Grade: 1 AFI: subjectively low.  Anatomic survey is incomplete for Spine, 4 ch,LVOT, ACI and normal; Gender - surprise.    Right Ovary is normal in appearance. Left Ovary is normal appearance. Survey of the adnexa demonstrates no adnexal masses. There is no free peritoneal fluid in the cul de sac.  Impression: 1. [redacted]w[redacted]d Viable Singleton Intrauterine pregnancy by U/S. 2. (U/S) EDD is not consistent with Clinically established Estimated Date of Delivery: 08/16/19 . 3. AFI is subjectively low 4. Limited exam due to body habitus.  Recommendations: 1.Clinical correlation with the patient's History and Physical Exam.   Jenine M. Albertine Grates    RDMS

## 2019-04-01 NOTE — Patient Instructions (Signed)

## 2019-04-02 LAB — GLUCOSE TOLERANCE, 1 HOUR: Glucose, 1Hr PP: 85 mg/dL (ref 65–199)

## 2019-04-07 ENCOUNTER — Encounter: Payer: Self-pay | Admitting: Certified Nurse Midwife

## 2019-04-15 ENCOUNTER — Other Ambulatory Visit: Payer: Self-pay

## 2019-04-15 ENCOUNTER — Ambulatory Visit (INDEPENDENT_AMBULATORY_CARE_PROVIDER_SITE_OTHER): Payer: 59

## 2019-04-15 ENCOUNTER — Ambulatory Visit (INDEPENDENT_AMBULATORY_CARE_PROVIDER_SITE_OTHER): Payer: 59 | Admitting: Certified Nurse Midwife

## 2019-04-15 VITALS — BP 117/72 | HR 78 | Wt 254.9 lb

## 2019-04-15 DIAGNOSIS — Z3402 Encounter for supervision of normal first pregnancy, second trimester: Secondary | ICD-10-CM

## 2019-04-15 DIAGNOSIS — Z3492 Encounter for supervision of normal pregnancy, unspecified, second trimester: Secondary | ICD-10-CM

## 2019-04-15 DIAGNOSIS — Z3A22 22 weeks gestation of pregnancy: Secondary | ICD-10-CM

## 2019-04-15 LAB — POCT URINALYSIS DIPSTICK OB
Bilirubin, UA: NEGATIVE
Blood, UA: NEGATIVE
Glucose, UA: NEGATIVE
Ketones, UA: NEGATIVE
Leukocytes, UA: NEGATIVE
Nitrite, UA: NEGATIVE
POC,PROTEIN,UA: NEGATIVE
Spec Grav, UA: <=1.005 — AB (ref 1.010–1.025)
Urobilinogen, UA: 0.2 E.U./dL
pH, UA: 6 (ref 5.0–8.0)

## 2019-04-15 NOTE — Patient Instructions (Signed)
Back Pain in Pregnancy Back pain during pregnancy is common. Back pain may be caused by several factors that are related to changes during your pregnancy. Follow these instructions at home: Managing pain, stiffness, and swelling      If directed, for sudden (acute) back pain, put ice on the painful area. ? Put ice in a plastic bag. ? Place a towel between your skin and the bag. ? Leave the ice on for 20 minutes, 2-3 times per day.  If directed, apply heat to the affected area before you exercise. Use the heat source that your health care provider recommends, such as a moist heat pack or a heating pad. ? Place a towel between your skin and the heat source. ? Leave the heat on for 20-30 minutes. ? Remove the heat if your skin turns bright red. This is especially important if you are unable to feel pain, heat, or cold. You may have a greater risk of getting burned.  If directed, massage the affected area. Activity  Exercise as told by your health care provider. Gentle exercise is the best way to prevent or manage back pain.  Listen to your body when lifting. If lifting hurts, ask for help or bend your knees. This uses your leg muscles instead of your back muscles.  Squat down when picking up something from the floor. Do not bend over.  Only use bed rest for short periods as told by your health care provider. Bed rest should only be used for the most severe episodes of back pain. Standing, sitting, and lying down  Do not stand in one place for long periods of time.  Use good posture when sitting. Make sure your head rests over your shoulders and is not hanging forward. Use a pillow on your lower back if necessary.  Try sleeping on your side, preferably the left side, with a pregnancy support pillow or 1-2 regular pillows between your legs. ? If you have back pain after a night's rest, your bed may be too soft. ? A firm mattress may provide more support for your back during pregnancy.  General instructions  Do not wear high heels.  Eat a healthy diet. Try to gain weight within your health care provider's recommendations.  Use a maternity girdle, elastic sling, or back brace as told by your health care provider.  Take over-the-counter and prescription medicines only as told by your health care provider.  Work with a physical therapist or massage therapist to find ways to manage back pain. Acupuncture or massage therapy may be helpful.  Keep all follow-up visits as told by your health care provider. This is important. Contact a health care provider if:  Your back pain interferes with your daily activities.  You have increasing pain in other parts of your body. Get help right away if:  You develop numbness, tingling, weakness, or problems with the use of your arms or legs.  You develop severe back pain that is not controlled with medicine.  You have a change in bowel or bladder control.  You develop shortness of breath, dizziness, or you faint.  You develop nausea, vomiting, or sweating.  You have back pain that is a rhythmic, cramping pain similar to labor pains. Labor pain is usually 1-2 minutes apart, lasts for about 1 minute, and involves a bearing down feeling or pressure in your pelvis.  You have back pain and your water breaks or you have vaginal bleeding.  You have back pain or numbness   that travels down your leg.  Your back pain developed after you fell.  You develop pain on one side of your back.  You see blood in your urine.  You develop skin blisters in the area of your back pain. Summary  Back pain may be caused by several factors that are related to changes during your pregnancy.  Follow instructions as told by your health care provider for managing pain, stiffness, and swelling.  Exercise as told by your health care provider. Gentle exercise is the best way to prevent or manage back pain.  Take over-the-counter and prescription  medicines only as told by your health care provider.  Keep all follow-up visits as told by your health care provider. This is important. This information is not intended to replace advice given to you by your health care provider. Make sure you discuss any questions you have with your health care provider. Document Released: 11/08/2005 Document Revised: 11/19/2018 Document Reviewed: 01/16/2018 Elsevier Patient Education  [place]. Round Ligament Pain  The round ligament is a cord of muscle and tissue that helps support the uterus. It can become a source of pain during pregnancy if it becomes stretched or twisted as the baby grows. The pain usually begins in the second trimester (13-28 weeks) of pregnancy, and it can come and go until the baby is delivered. It is not a serious problem, and it does not cause harm to the baby. Round ligament pain is usually a short, sharp, and pinching pain, but it can also be a dull, lingering, and aching pain. The pain is felt in the lower side of the abdomen or in the groin. It usually starts deep in the groin and moves up to the outside of the hip area. The pain may occur when you:  Suddenly change position, such as quickly going from a sitting to standing position.  Roll over in bed.  Cough or sneeze.  Do physical activity. Follow these instructions at home:   Watch your condition for any changes.  When the pain starts, relax. Then try any of these methods to help with the pain: ? Sitting down. ? Flexing your knees up to your abdomen. ? Lying on your side with one pillow under your abdomen and another pillow between your legs. ? Sitting in a warm bath for 15-20 minutes or until the pain goes away.  Take over-the-counter and prescription medicines only as told by your health care provider.  Move slowly when you sit down or stand up.  Avoid long walks if they cause pain.  Stop or reduce your physical activities if they cause pain.  Keep  all follow-up visits as told by your health care provider. This is important. Contact a health care provider if:  Your pain does not go away with treatment.  You feel pain in your back that you did not have before.  Your medicine is not helping. Get help right away if:  You have a fever or chills.  You develop uterine contractions.  You have vaginal bleeding.  You have nausea or vomiting.  You have diarrhea.  You have pain when you urinate. Summary  Round ligament pain is felt in the lower abdomen or groin. It is usually a short, sharp, and pinching pain. It can also be a dull, lingering, and aching pain.  This pain usually begins in the second trimester (13-28 weeks). It occurs because the uterus is stretching with the growing baby, and it is not harmful to  the baby.  You may notice the pain when you suddenly change position, when you cough or sneeze, or during physical activity.  Relaxing, flexing your knees to your abdomen, lying on one side, or taking a warm bath may help to get rid of the pain.  Get help from your health care provider if the pain does not go away or if you have vaginal bleeding, nausea, vomiting, diarrhea, or painful urination. This information is not intended to replace advice given to you by your health care provider. Make sure you discuss any questions you have with your health care provider. Document Released: 05/09/2008 Document Revised: 01/16/2018 Document Reviewed: 01/16/2018 Elsevier Patient Education  2020 [employer]  

## 2019-04-15 NOTE — Progress Notes (Signed)
ROB-Doing well, no questions or concerns. Follow up anatomy scan today-complete and normal; findings discussed with patient. Anticipatory guidance regarding course of prenatal care. Reviewed red flag symptoms and when to call. RTC as previously scheduled or sooner if needed.   ULTRASOUND REPORT  Location: [facility] Date of Service: 04/15/2019   Indications: Anatomy follow up ultrasound Findings:  Singleton intrauterine pregnancy is visualized with FHR at 150 BPM.  Fetal presentation is Transverse.  Placenta: anterior. Grade: 1 AFI: subjectively normal.  Anatomic survey is complete.   There is no free peritoneal fluid in the cul de sac.  Impression: 1. [redacted]w[redacted]d Viable Singleton Intrauterine pregnancy previously established criteria. 2. Normal Anatomy Scan is now complete  Recommendations: 1.Clinical correlation with the patient's History and Physical Exam.

## 2019-04-15 NOTE — Progress Notes (Signed)
ROB-No complaints.  

## 2019-04-29 ENCOUNTER — Other Ambulatory Visit: Payer: Self-pay

## 2019-04-29 ENCOUNTER — Ambulatory Visit (INDEPENDENT_AMBULATORY_CARE_PROVIDER_SITE_OTHER): Payer: 59 | Admitting: Obstetrics and Gynecology

## 2019-04-29 VITALS — BP 107/75 | HR 76 | Wt 261.0 lb

## 2019-04-29 DIAGNOSIS — K5900 Constipation, unspecified: Secondary | ICD-10-CM | POA: Insufficient documentation

## 2019-04-29 DIAGNOSIS — Z3492 Encounter for supervision of normal pregnancy, unspecified, second trimester: Secondary | ICD-10-CM

## 2019-04-29 DIAGNOSIS — Z23 Encounter for immunization: Secondary | ICD-10-CM | POA: Diagnosis not present

## 2019-04-29 LAB — POCT URINALYSIS DIPSTICK OB
Bilirubin, UA: NEGATIVE
Blood, UA: NEGATIVE
Glucose, UA: NEGATIVE
Ketones, UA: NEGATIVE
Leukocytes, UA: NEGATIVE
Nitrite, UA: NEGATIVE
POC,PROTEIN,UA: NEGATIVE
Spec Grav, UA: 1.02 (ref 1.010–1.025)
Urobilinogen, UA: 0.2 E.U./dL
pH, UA: 6.5 (ref 5.0–8.0)

## 2019-04-29 MED ORDER — POLYETHYLENE GLYCOL 3350 17 GM/SCOOP PO POWD: 1.0000 | Freq: Once | ORAL | 2 refills | Status: AC

## 2019-04-29 NOTE — Progress Notes (Signed)
ROB- pt is doing well 

## 2019-04-29 NOTE — Patient Instructions (Signed)

## 2019-04-29 NOTE — Progress Notes (Signed)
ROB- reviewed ready,set,baby video,Flu vaccine given. reports constipation getting worse,  Has increased water and fiber intake.  Will try miralax. glucola next visit. Discussed enrolling in classes online.

## 2019-05-27 ENCOUNTER — Other Ambulatory Visit: Payer: 59

## 2019-05-27 ENCOUNTER — Other Ambulatory Visit: Payer: Self-pay

## 2019-05-27 ENCOUNTER — Ambulatory Visit (INDEPENDENT_AMBULATORY_CARE_PROVIDER_SITE_OTHER): Payer: 59 | Admitting: Certified Nurse Midwife

## 2019-05-27 VITALS — BP 120/74 | HR 80 | Wt 261.2 lb

## 2019-05-27 DIAGNOSIS — Z3A28 28 weeks gestation of pregnancy: Secondary | ICD-10-CM

## 2019-05-27 DIAGNOSIS — Z23 Encounter for immunization: Secondary | ICD-10-CM

## 2019-05-27 DIAGNOSIS — Z113 Encounter for screening for infections with a predominantly sexual mode of transmission: Secondary | ICD-10-CM

## 2019-05-27 DIAGNOSIS — Z3493 Encounter for supervision of normal pregnancy, unspecified, third trimester: Secondary | ICD-10-CM

## 2019-05-27 DIAGNOSIS — Z131 Encounter for screening for diabetes mellitus: Secondary | ICD-10-CM

## 2019-05-27 DIAGNOSIS — Z130 Encounter for screening for diseases of the blood and blood-forming organs and certain disorders involving the immune mechanism: Secondary | ICD-10-CM

## 2019-05-27 LAB — POCT URINALYSIS DIPSTICK OB
Bilirubin, UA: NEGATIVE
Blood, UA: NEGATIVE
Glucose, UA: NEGATIVE
Nitrite, UA: NEGATIVE
POC,PROTEIN,UA: NEGATIVE
Spec Grav, UA: 1.01 (ref 1.010–1.025)
Urobilinogen, UA: 0.2 E.U./dL
pH, UA: 6.5 (ref 5.0–8.0)

## 2019-05-27 MED ORDER — TETANUS-DIPHTH-ACELL PERTUSSIS 5-2.5-18.5 LF-MCG/0.5 IM SUSP
0.5000 mL | Freq: Once | INTRAMUSCULAR | Status: AC
  Administered 2019-05-27: 0.5 mL via INTRAMUSCULAR

## 2019-05-27 NOTE — Progress Notes (Signed)
ROB-Doing well. Education regarding counting fetal movement in the setting of an anterior placenta. 28 week labs today, see orders. Third trimester handouts provided. TDaP given. Blood transfusion consent reviewed and signed. Plans breastfeeding, epidural, POP, and circumcision if female. Anticipatory guidance regarding course of prenatal care. Reviewed red flag symptoms and when to call. RTC x 2 weeks for ROB or sooner if needed.

## 2019-05-27 NOTE — Patient Instructions (Addendum)
Fetal Movement Counts Patient Name: ________________________________________________ Patient Due Date: ____________________ What is a fetal movement count?  A fetal movement count is the number of times that you feel your baby move during a certain amount of time. This may also be called a fetal kick count. A fetal movement count is recommended for every pregnant woman. You may be asked to start counting fetal movements as early as week 28 of your pregnancy. Pay attention to when your baby is most active. You may notice your baby's sleep and wake cycles. You may also notice things that make your baby move more. You should do a fetal movement count:  When your baby is normally most active.  At the same time each day. A good time to count movements is while you are resting, after having something to eat and drink. How do I count fetal movements? 1. Find a quiet, comfortable area. Sit, or lie down on your side. 2. Write down the date, the start time and stop time, and the number of movements that you felt between those two times. Take this information with you to your health care visits. 3. For 2 hours, count kicks, flutters, swishes, rolls, and jabs. You should feel at least 10 movements during 2 hours. 4. You may stop counting after you have felt 10 movements. 5. If you do not feel 10 movements in 2 hours, have something to eat and drink. Then, keep resting and counting for 1 hour. If you feel at least 4 movements during that hour, you may stop counting. Contact a health care provider if:  You feel fewer than 4 movements in 2 hours.  Your baby is not moving like he or she usually does. Date: ____________ Start time: ____________ Stop time: ____________ Movements: ____________ Date: ____________ Start time: ____________ Stop time: ____________ Movements: ____________ Date: ____________ Start time: ____________ Stop time: ____________ Movements: ____________ Date: ____________ Start time:  ____________ Stop time: ____________ Movements: ____________ Date: ____________ Start time: ____________ Stop time: ____________ Movements: ____________ Date: ____________ Start time: ____________ Stop time: ____________ Movements: ____________ Date: ____________ Start time: ____________ Stop time: ____________ Movements: ____________ Date: ____________ Start time: ____________ Stop time: ____________ Movements: ____________ Date: ____________ Start time: ____________ Stop time: ____________ Movements: ____________ This information is not intended to replace advice given to you by your health care provider. Make sure you discuss any questions you have with your health care provider. Document Released: 08/30/2006 Document Revised: 08/20/2018 Document Reviewed: 09/09/2015 Elsevier Patient Education  2020 [employer].   Third Trimester of Pregnancy  The third trimester is from week 28 through week 40 (months 7 through 9). This trimester is when your unborn baby (fetus) is growing very fast. At the end of the ninth month, the unborn baby is about 20 inches in length. It weighs about 6-10 pounds. Follow these instructions at home: Medicines  Take over-the-counter and prescription medicines only as told by your doctor. Some medicines are safe and some medicines are not safe during pregnancy.  Take a prenatal vitamin that contains at least 600 micrograms (mcg) of folic acid.  If you have trouble pooping (constipation), take medicine that will make your stool soft (stool softener) if your doctor approves. Eating and drinking   Eat regular, healthy meals.  Avoid raw meat and uncooked cheese.  If you get low calcium from the food you eat, talk to your doctor about taking a daily calcium supplement.  Eat four or five small meals rather than three large meals a  day.  Avoid foods that are high in fat and sugars, such as fried and sweet foods.  To prevent constipation: ? Eat foods that are  high in fiber, like fresh fruits and vegetables, whole grains, and beans. ? Drink enough fluids to keep your pee (urine) clear or pale yellow. Activity  Exercise only as told by your doctor. Stop exercising if you start to have cramps.  Avoid heavy lifting, wear low heels, and sit up straight.  Do not exercise if it is too hot, too humid, or if you are in a place of great height (high altitude).  You may continue to have sex unless your doctor tells you not to. Relieving pain and discomfort  Wear a good support bra if your breasts are tender.  Take frequent breaks and rest with your legs raised if you have leg cramps or low back pain.  Take warm water baths (sitz baths) to soothe pain or discomfort caused by hemorrhoids. Use hemorrhoid cream if your doctor approves.  If you develop puffy, bulging veins (varicose veins) in your legs: ? Wear support hose or compression stockings as told by your doctor. ? Raise (elevate) your feet for 15 minutes, 3-4 times a day. ? Limit salt in your food. Safety  Wear your seat belt when driving.  Make a list of emergency phone numbers, including numbers for family, friends, the hospital, and police and fire departments. Preparing for your baby's arrival To prepare for the arrival of your baby:  Take prenatal classes.  Practice driving to the hospital.  Visit the hospital and tour the maternity area.  Talk to your work about taking leave once the baby comes.  Pack your hospital bag.  Prepare the baby's room.  Go to your doctor visits.  Buy a rear-facing car seat. Learn how to install it in your car. General instructions  Do not use hot tubs, steam rooms, or saunas.  Do not use any products that contain nicotine or tobacco, such as cigarettes and e-cigarettes. If you need help quitting, ask your doctor.  Do not drink alcohol.  Do not douche or use tampons or scented sanitary pads.  Do not cross your legs for long periods of time.   Do not travel for long distances unless you must. Only do so if your doctor says it is okay.  Visit your dentist if you have not gone during your pregnancy. Use a soft toothbrush to brush your teeth. Be gentle when you floss.  Avoid cat litter boxes and soil used by cats. These carry germs that can cause birth defects in the baby and can cause a loss of your baby (miscarriage) or stillbirth.  Keep all your prenatal visits as told by your doctor. This is important. Contact a doctor if:  You are not sure if you are in labor or if your water has broken.  You are dizzy.  You have mild cramps or pressure in your lower belly.  You have a nagging pain in your belly area.  You continue to feel sick to your stomach, you throw up, or you have watery poop.  You have bad smelling fluid coming from your vagina.  You have pain when you pee. Get help right away if:  You have a fever.  You are leaking fluid from your vagina.  You are spotting or bleeding from your vagina.  You have severe belly cramps or pain.  You lose or gain weight quickly.  You have trouble catching your breath  and have chest pain.  You notice sudden or extreme puffiness (swelling) of your face, hands, ankles, feet, or legs.  You have not felt the baby move in over an hour.  You have severe headaches that do not go away with medicine.  You have trouble seeing.  You are leaking, or you are having a gush of fluid, from your vagina before you are 37 weeks.  You have regular belly spasms (contractions) before you are 37 weeks. Summary  The third trimester is from week 28 through week 40 (months 7 through 9). This time is when your unborn baby is growing very fast.  Follow your doctor's advice about medicine, food, and activity.  Get ready for the arrival of your baby by taking prenatal classes, getting all the baby items ready, preparing the baby's room, and visiting your doctor to be checked.  Get help  right away if you are bleeding from your vagina, or you have chest pain and trouble catching your breath, or if you have not felt your baby move in over an hour. This information is not intended to replace advice given to you by your health care provider. Make sure you discuss any questions you have with your health care provider. Document Released: 10/25/2009 Document Revised: 11/21/2018 Document Reviewed: 09/05/2016 Elsevier Patient Education  2020 [place] for Pregnant Women While you are pregnant, your body requires additional nutrition to help support your growing baby. You also have a higher need for some vitamins and minerals, such as folic acid, calcium, iron, and vitamin D. Eating a healthy, well-balanced diet is very important for your health and your baby's health. Your need for extra calories varies for the three 33-month segments of your pregnancy (trimesters). For most women, it is recommended to consume:  150 extra calories a day during the first trimester.  300 extra calories a day during the second trimester.  300 extra calories a day during the third trimester. What are tips for following this plan?   Do not try to lose weight or go on a diet during pregnancy.  Limit your overall intake of foods that have "empty calories." These are foods that have little nutritional value, such as sweets, desserts, candies, and sugar-sweetened beverages.  Eat a variety of foods (especially fruits and vegetables) to get a full range of vitamins and minerals.  Take a prenatal vitamin to help meet your additional vitamin and mineral needs during pregnancy, specifically for folic acid, iron, calcium, and vitamin D.  Remember to stay active. Ask your health care provider what types of exercise and activities are safe for you.  Practice good food safety and cleanliness. Wash your hands before you eat and after you prepare raw meat. Wash all fruits and vegetables well before  peeling or eating. Taking these actions can help to prevent food-borne illnesses that can be very dangerous to your baby, such as listeriosis. Ask your health care provider for more information about listeriosis. What does 150 extra calories look like? Healthy options that provide 150 extra calories each day could be any of the following:  6-8 oz (170-230 g) of plain low-fat yogurt with  cup of berries.  1 apple with 2 teaspoons (11 g) of peanut butter.  Cut-up vegetables with  cup (60 g) of hummus.  8 oz (230 mL) or 1 cup of low-fat chocolate milk.  1 stick of string cheese with 1 medium orange.  1 peanut butter and jelly sandwich that is  made with one slice of whole-wheat bread and 1 tsp (5 g) of peanut butter. For 300 extra calories, you could eat two of those healthy options each day. What is a healthy amount of weight to gain? The right amount of weight gain for you is based on your BMI before you became pregnant. If your BMI:  Was less than 18 (underweight), you should gain 28-40 lb (13-18 kg).  Was 18-24.9 (normal), you should gain 25-35 lb (11-16 kg).  Was 25-29.9 (overweight), you should gain 15-25 lb (7-11 kg).  Was 30 or greater (obese), you should gain 11-20 lb (5-9 kg). What if I am having twins or multiples? Generally, if you are carrying twins or multiples:  You may need to eat 300-600 extra calories a day.  The recommended range for total weight gain is 25-54 lb (11-25 kg), depending on your BMI before pregnancy.  Talk with your health care provider to find out about nutritional needs, weight gain, and exercise that is right for you. What foods can I eat?  Grains All grains. Choose whole grains, such as whole-wheat bread, oatmeal, or brown rice. Vegetables All vegetables. Eat a variety of colors and types of vegetables. Remember to wash your vegetables well before peeling or eating. Fruits All fruits. Eat a variety of colors and types of fruit. Remember to  wash your fruits well before peeling or eating. Meats and other protein foods Lean meats, including chicken, [place], fish, and lean cuts of beef, veal, or pork. If you eat fish or seafood, choose options that are higher in omega-3 fatty acids and lower in mercury, such as salmon, herring, mussels, trout, sardines, pollock, shrimp, crab, and lobster. Tofu. Tempeh. Beans. Eggs. Peanut butter and other nut butters. Make sure that all meats, poultry, and eggs are cooked to food-safe temperatures or "well-done." Two or more servings of fish are recommended each week in order to get the most benefits from omega-3 fatty acids that are found in seafood. Choose fish that are lower in mercury. You can find more information online:  PumpkinSearch.com.ee Dairy Pasteurized milk and milk alternatives (such as almond milk). Pasteurized yogurt and pasteurized cheese. Cottage cheese. Sour cream. Beverages Water. Juices that contain 100% fruit juice or vegetable juice. Caffeine-free teas and decaffeinated coffee. Drinks that contain caffeine are okay to drink, but it is better to avoid caffeine. Keep your total caffeine intake to less than 200 mg each day (which is 12 oz or 355 mL of coffee, tea, or soda) or the limit as told by your health care provider. Fats and oils Fats and oils are okay to include in moderation. Sweets and desserts Sweets and desserts are okay to include in moderation. Seasoning and other foods All pasteurized condiments. The items listed above may not be a complete list of recommended foods and beverages. Contact your dietitian for more options. The items listed above may not be a complete list of foods and beverages [you/your child] can eat. Contact a dietitian for more information. What foods are not recommended? Vegetables Raw (unpasteurized) vegetable juices. Fruits Unpasteurized fruit juices. Meats and other protein foods Lunch meats, bologna, hot dogs, or other deli meats. (If you must eat  those meats, reheat them until they are steaming hot.) Refrigerated pat, meat spreads from a meat counter, smoked seafood that is found in the refrigerated section of a store. Raw or undercooked meats, poultry, and eggs. Raw fish, such as sushi or sashimi. Fish that have high mercury content, such as tilefish,  shark, swordfish, and king mackerel. To learn more about mercury in fish, talk with your health care provider or look for online resources, such as:  PumpkinSearch.com.ee Dairy Raw (unpasteurized) milk and any foods that have raw milk in them. Soft cheeses, such as feta, queso blanco, queso fresco, Brie, Camembert cheeses, blue-veined cheeses, and Panela cheese (unless it is made with pasteurized milk, which must be stated on the label). Beverages Alcohol. Sugar-sweetened beverages, such as sodas, teas, or energy drinks. Seasoning and other foods Homemade fermented foods and drinks, such as pickles, sauerkraut, or kombucha drinks. (Store-bought pasteurized versions of these are okay.) Salads that are made in a store or deli, such as ham salad, chicken salad, egg salad, tuna salad, and seafood salad. The items listed above may not be a complete list of foods and beverages to avoid. Contact your dietitian for more information. The items listed above may not be a complete list of foods and beverages [you/your child] should avoid. Contact a dietitian for more information. Where to find more information To calculate the number of calories you need based on your height, weight, and activity level, you can use an online calculator such as:  PackageNews.is To calculate how much weight you should gain during pregnancy, you can use an online pregnancy weight gain calculator such as:  http://jones-berg.com/ Summary  While you are pregnant, your body requires additional nutrition to help support your growing baby.  Eat a variety of foods, especially fruits  and vegetables to get a full range of vitamins and minerals.  Practice good food safety and cleanliness. Wash your hands before you eat and after you prepare raw meat. Wash all fruits and vegetables well before peeling or eating. Taking these actions can help to prevent food-borne illnesses, such as listeriosis, that can be very dangerous to your baby.  Do not eat raw meat or fish. Do not eat fish that have high mercury content, such as tilefish, shark, swordfish, and king mackerel. Do not eat unpasteurized (raw) dairy.  Take a prenatal vitamin to help meet your additional vitamin and mineral needs during pregnancy, specifically for folic acid, iron, calcium, and vitamin D. This information is not intended to replace advice given to you by your health care provider. Make sure you discuss any questions you have with your health care provider. Document Released: 05/15/2014 Document Revised: 11/21/2018 Document Reviewed: 04/27/2017 Elsevier Patient Education  2020 [employer]   Pain Relief During Labor and Delivery Many things can cause pain during labor and delivery, including:  Pressure on bones and ligaments due to the baby moving through the pelvis.  Stretching of tissues due to the baby moving through the birth canal.  Muscle tension due to anxiety or nervousness.  The uterus tightening (contracting) and relaxing to help move the baby. There are many ways to deal with the pain of labor and delivery. They include:  Taking prenatal classes. Taking these classes helps you know what to expect during your baby's birth. What you learn will increase your confidence and decrease your anxiety.  Practicing relaxation techniques or doing relaxing activities, such as: ? Focused breathing. ? Meditation. ? Visualization. ? Aroma therapy. ? Listening to your favorite music. ? Hypnosis.  Taking a warm shower or bath (hydrotherapy). This may: ? Provide comfort and relaxation. ? Lessen your  perception of pain. ? Decrease the amount of pain medicine needed. ? Decrease the length of labor.  Getting a massage or counterpressure on your back.  Applying warm packs or  ice packs.  Changing positions often, moving around, or using a birthing ball.  Getting: ? Pain medicine through an IV or injection into a muscle. ? Pain medicine inserted into your spinal column. ? Injections of sterile water just under the skin on your lower back (intradermal injections). ? Laughing gas (nitrous oxide). Discuss your pain control options with your health care provider during your prenatal visits. Explore the options offered by your hospital or birth center. What kinds of medicine are available? There are two kinds of medicines that can be used to relieve pain during labor and delivery:  Analgesics. These medicines decrease pain without causing you to lose feeling or the ability to move your muscles.  Anesthetics. These medicines block feeling in the body and can decrease your ability to move freely. Both of these kinds of medicine can cause minor side effects, such as nausea, trouble concentrating, and sleepiness. They can also decrease the baby's heart rate before birth and affect the baby's breathing rate after birth. For this reason, health care providers are careful about when and how much medicine is given. What are specific medicines and procedures that provide pain relief? Local Anesthetics Local anesthetics are used to numb a small area of the body. They may be used along with another kind of anesthetic or used to numb the nerves of the vagina, cervix, and perineum during the second stage of labor. General Anesthetics General anesthetics cause you to lose consciousness so you do not feel pain. They are usually only used for an emergency cesarean delivery. General anesthetics are given through an IV tube and a mask. Pudendal Block A pudendal block is a form of local anesthetic. It may be  used to relieve the pain associated with pushing or stretching of the perineum at the time of delivery or to further numb the perineum. A pudendal block is done by injecting numbing medicine through the vaginal wall into a nerve in the pelvis. Epidural Analgesia Epidural analgesia is given through a flexible IV catheter that is inserted into the lower back. Numbing medicine is delivered continuously to the area near your spinal column nerves (epidural space). After having this type of analgesia, you may be able to move your legs but you most likely will not be able to walk. Depending on the amount of medicine given, you may lose all feeling in the lower half of your body, or you may retain some level of sensation, including the urge to push. Epidural analgesia can be used to provide pain relief for a vaginal birth. Spinal Block A spinal block is similar to epidural analgesia, but the medicine is injected into the spinal fluid instead of the epidural space. A spinal block is only given once. It starts to relieve pain quickly, but the pain relief lasts only 1-6 hours. Spinal blocks can be used for cesarean deliveries. Combined Spinal-Epidural (CSE) Block A CSE block combines the effects of a spinal block and epidural analgesia. The spinal block works quickly to block all pain. The epidural analgesia provides continuous pain relief, even after the effects of the spinal block have worn off. This information is not intended to replace advice given to you by your health care provider. Make sure you discuss any questions you have with your health care provider. Document Released: 11/16/2008 Document Revised: 07/13/2017 Document Reviewed: 12/22/2015 Elsevier Patient Education  2020 [employer].   Breastfeeding  Choosing to breastfeed is one of the best decisions you can make for yourself and your baby.  A change in hormones during pregnancy causes your breasts to make breast milk in your milk-producing  glands. Hormones prevent breast milk from being released before your baby is born. They also prompt milk flow after birth. Once breastfeeding has begun, thoughts of your baby, as well as his or her sucking or crying, can stimulate the release of milk from your milk-producing glands. Benefits of breastfeeding Research shows that breastfeeding offers many health benefits for infants and mothers. It also offers a cost-free and convenient way to feed your baby. For your baby  Your first milk (colostrum) helps your baby's digestive system to function better.  Special cells in your milk (antibodies) help your baby to fight off infections.  Breastfed babies are less likely to develop asthma, allergies, obesity, or type 2 diabetes. They are also at lower risk for sudden infant death syndrome (SIDS).  Nutrients in breast milk are better able to meet your baby's needs compared to infant formula.  Breast milk improves your baby's brain development. For you  Breastfeeding helps to create a very special bond between you and your baby.  Breastfeeding is convenient. Breast milk costs nothing and is always available at the correct temperature.  Breastfeeding helps to burn calories. It helps you to lose the weight that you gained during pregnancy.  Breastfeeding makes your uterus return faster to its size before pregnancy. It also slows bleeding (lochia) after you give birth.  Breastfeeding helps to lower your risk of developing type 2 diabetes, osteoporosis, rheumatoid arthritis, cardiovascular disease, and breast, ovarian, uterine, and endometrial cancer later in life. Breastfeeding basics Starting breastfeeding  Find a comfortable place to sit or lie down, with your neck and back well-supported.  Place a pillow or a rolled-up blanket under your baby to bring him or her to the level of your breast (if you are seated). Nursing pillows are specially designed to help support your arms and your baby while  you breastfeed.  Make sure that your baby's tummy (abdomen) is facing your abdomen.  Gently massage your breast. With your fingertips, massage from the outer edges of your breast inward toward the nipple. This encourages milk flow. If your milk flows slowly, you may need to continue this action during the feeding.  Support your breast with 4 fingers underneath and your thumb above your nipple (make the letter "C" with your hand). Make sure your fingers are well away from your nipple and your baby's mouth.  Stroke your baby's lips gently with your finger or nipple.  When your baby's mouth is open wide enough, quickly bring your baby to your breast, placing your entire nipple and as much of the areola as possible into your baby's mouth. The areola is the colored area around your nipple. ? More areola should be visible above your baby's upper lip than below the lower lip. ? Your baby's lips should be opened and extended outward (flanged) to ensure an adequate, comfortable latch. ? Your baby's tongue should be between his or her lower gum and your breast.  Make sure that your baby's mouth is correctly positioned around your nipple (latched). Your baby's lips should create a seal on your breast and be turned out (everted).  It is common for your baby to suck about 2-3 minutes in order to start the flow of breast milk. Latching Teaching your baby how to latch onto your breast properly is very important. An improper latch can cause nipple pain, decreased milk supply, and poor weight gain in your  baby. Also, if your baby is not latched onto your nipple properly, he or she may swallow some air during feeding. This can make your baby fussy. Burping your baby when you switch breasts during the feeding can help to get rid of the air. However, teaching your baby to latch on properly is still the best way to prevent fussiness from swallowing air while breastfeeding. Signs that your baby has successfully  latched onto your nipple  Silent tugging or silent sucking, without causing you pain. Infant's lips should be extended outward (flanged).  Swallowing heard between every 3-4 sucks once your milk has started to flow (after your let-down milk reflex occurs).  Muscle movement above and in front of his or her ears while sucking. Signs that your baby has not successfully latched onto your nipple  Sucking sounds or smacking sounds from your baby while breastfeeding.  Nipple pain. If you think your baby has not latched on correctly, slip your finger into the corner of your baby's mouth to break the suction and place it between your baby's gums. Attempt to start breastfeeding again. Signs of successful breastfeeding Signs from your baby  Your baby will gradually decrease the number of sucks or will completely stop sucking.  Your baby will fall asleep.  Your baby's body will relax.  Your baby will retain a small amount of milk in his or her mouth.  Your baby will let go of your breast by himself or herself. Signs from you  Breasts that have increased in firmness, weight, and size 1-3 hours after feeding.  Breasts that are softer immediately after breastfeeding.  Increased milk volume, as well as a change in milk consistency and color by the fifth day of breastfeeding.  Nipples that are not sore, cracked, or bleeding. Signs that your baby is getting enough milk  Wetting at least 1-2 diapers during the first 24 hours after birth.  Wetting at least 5-6 diapers every 24 hours for the first week after birth. The urine should be clear or pale yellow by the age of 5 days.  Wetting 6-8 diapers every 24 hours as your baby continues to grow and develop.  At least 3 stools in a 24-hour period by the age of 5 days. The stool should be soft and yellow.  At least 3 stools in a 24-hour period by the age of 7 days. The stool should be seedy and yellow.  No loss of weight greater than 10% of  birth weight during the first 3 days of life.  Average weight gain of 4-7 oz (113-198 g) per week after the age of 4 days.  Consistent daily weight gain by the age of 5 days, without weight loss after the age of 2 weeks. After a feeding, your baby may spit up a small amount of milk. This is normal. Breastfeeding frequency and duration Frequent feeding will help you make more milk and can prevent sore nipples and extremely full breasts (breast engorgement). Breastfeed when you feel the need to reduce the fullness of your breasts or when your baby shows signs of hunger. This is called "breastfeeding on demand." Signs that your baby is hungry include:  Increased alertness, activity, or restlessness.  Movement of the head from side to side.  Opening of the mouth when the corner of the mouth or cheek is stroked (rooting).  Increased sucking sounds, smacking lips, cooing, sighing, or squeaking.  Hand-to-mouth movements and sucking on fingers or hands.  Fussing or crying. Avoid introducing  a pacifier to your baby in the first 4-6 weeks after your baby is born. After this time, you may choose to use a pacifier. Research has shown that pacifier use during the first year of a baby's life decreases the risk of sudden infant death syndrome (SIDS). Allow your baby to feed on each breast as long as he or she wants. When your baby unlatches or falls asleep while feeding from the first breast, offer the second breast. Because newborns are often sleepy in the first few weeks of life, you may need to awaken your baby to get him or her to feed. Breastfeeding times will vary from baby to baby. However, the following rules can serve as a guide to help you make sure that your baby is properly fed:  Newborns (babies 50 weeks of age or younger) may breastfeed every 1-3 hours.  Newborns should not go without breastfeeding for longer than 3 hours during the day or 5 hours during the night.  You should breastfeed  your baby a minimum of 8 times in a 24-hour period. Breast milk pumping     Pumping and storing breast milk allows you to make sure that your baby is exclusively fed your breast milk, even at times when you are unable to breastfeed. This is especially important if you go back to work while you are still breastfeeding, or if you are not able to be present during feedings. Your lactation consultant can help you find a method of pumping that works best for you and give you guidelines about how long it is safe to store breast milk. Caring for your breasts while you breastfeed Nipples can become dry, cracked, and sore while breastfeeding. The following recommendations can help keep your breasts moisturized and healthy:  Avoid using soap on your nipples.  Wear a supportive bra designed especially for nursing. Avoid wearing underwire-style bras or extremely tight bras (sports bras).  Air-dry your nipples for 3-4 minutes after each feeding.  Use only cotton bra pads to absorb leaked breast milk. Leaking of breast milk between feedings is normal.  Use lanolin on your nipples after breastfeeding. Lanolin helps to maintain your skin's normal moisture barrier. Pure lanolin is not harmful (not toxic) to your baby. You may also hand express a few drops of breast milk and gently massage that milk into your nipples and allow the milk to air-dry. In the first few weeks after giving birth, some women experience breast engorgement. Engorgement can make your breasts feel heavy, warm, and tender to the touch. Engorgement peaks within 3-5 days after you give birth. The following recommendations can help to ease engorgement:  Completely empty your breasts while breastfeeding or pumping. You may want to start by applying warm, moist heat (in the shower or with warm, water-soaked hand towels) just before feeding or pumping. This increases circulation and helps the milk flow. If your baby does not completely empty your  breasts while breastfeeding, pump any extra milk after he or she is finished.  Apply ice packs to your breasts immediately after breastfeeding or pumping, unless this is too uncomfortable for you. To do this: ? Put ice in a plastic bag. ? Place a towel between your skin and the bag. ? Leave the ice on for 20 minutes, 2-3 times a day.  Make sure that your baby is latched on and positioned properly while breastfeeding. If engorgement persists after 48 hours of following these recommendations, contact your health care provider or a Advertising copywriter.  Overall health care recommendations while breastfeeding  Eat 3 healthy meals and 3 snacks every day. Well-nourished mothers who are breastfeeding need an additional 450-500 calories a day. You can meet this requirement by increasing the amount of a balanced diet that you eat.  Drink enough water to keep your urine pale yellow or clear.  Rest often, relax, and continue to take your prenatal vitamins to prevent fatigue, stress, and low vitamin and mineral levels in your body (nutrient deficiencies).  Do not use any products that contain nicotine or tobacco, such as cigarettes and e-cigarettes. Your baby may be harmed by chemicals from cigarettes that pass into breast milk and exposure to secondhand smoke. If you need help quitting, ask your health care provider.  Avoid alcohol.  Do not use illegal drugs or marijuana.  Talk with your health care provider before taking any medicines. These include over-the-counter and prescription medicines as well as vitamins and herbal supplements. Some medicines that may be harmful to your baby can pass through breast milk.  It is possible to become pregnant while breastfeeding. If birth control is desired, ask your health care provider about options that will be safe while breastfeeding your baby. Where to find more information: [employer]: www.llli.org Contact a health care provider if:   You feel like you want to stop breastfeeding or have become frustrated with breastfeeding.  Your nipples are cracked or bleeding.  Your breasts are red, tender, or warm.  You have: ? Painful breasts or nipples. ? A swollen area on either breast. ? A fever or chills. ? Nausea or vomiting. ? Drainage other than breast milk from your nipples.  Your breasts do not become full before feedings by the fifth day after you give birth.  You feel sad and depressed.  Your baby is: ? Too sleepy to eat well. ? Having trouble sleeping. ? More than 80 week old and wetting fewer than 6 diapers in a 24-hour period. ? Not gaining weight by 3 days of age.  Your baby has fewer than 3 stools in a 24-hour period.  Your baby's skin or the white parts of his or her eyes become yellow. Get help right away if:  Your baby is overly tired (lethargic) and does not want to wake up and feed.  Your baby develops an unexplained fever. Summary  Breastfeeding offers many health benefits for infant and mothers.  Try to breastfeed your infant when he or she shows early signs of hunger.  Gently tickle or stroke your baby's lips with your finger or nipple to allow the baby to open his or her mouth. Bring the baby to your breast. Make sure that much of the areola is in your baby's mouth. Offer one side and burp the baby before you offer the other side.  Talk with your health care provider or lactation consultant if you have questions or you face problems as you breastfeed. This information is not intended to replace advice given to you by your health care provider. Make sure you discuss any questions you have with your health care provider. Document Released: 07/31/2005 Document Revised: 10/25/2017 Document Reviewed: 09/01/2016 Elsevier Patient Education  2020 [employer].  [facility]  [address] [place], [place], [place] [address]  Phone: [phone]   [facility] (second location)  [address] [place], [place] [address]  Phone: [phone]   [facility]) [address] [place], [place], [place] [address]  Phone: [phone]   [facility]  [address] [place], [place] [address]  Phone: [phone]

## 2019-05-27 NOTE — Progress Notes (Signed)
ROB-Questions regarding fetal movement, somedays more FM than others.

## 2019-05-28 LAB — CBC
Hematocrit: 37 % (ref 34.0–46.6)
Hemoglobin: 12.3 g/dL (ref 11.1–15.9)
MCH: 30.1 pg (ref 26.6–33.0)
MCHC: 33.2 g/dL (ref 31.5–35.7)
MCV: 91 fL (ref 79–97)
Platelets: 305 10*3/uL (ref 150–450)
RBC: 4.09 x10E6/uL (ref 3.77–5.28)
RDW: 12.5 % (ref 11.7–15.4)
WBC: 10.8 10*3/uL (ref 3.4–10.8)

## 2019-05-28 LAB — GLUCOSE, 1 HOUR GESTATIONAL: Gestational Diabetes Screen: 121 mg/dL (ref 65–139)

## 2019-05-28 LAB — RPR: RPR Ser Ql: NONREACTIVE

## 2019-06-10 ENCOUNTER — Other Ambulatory Visit: Payer: Self-pay

## 2019-06-10 ENCOUNTER — Ambulatory Visit (INDEPENDENT_AMBULATORY_CARE_PROVIDER_SITE_OTHER): Payer: 59 | Admitting: Certified Nurse Midwife

## 2019-06-10 VITALS — BP 114/72 | HR 77 | Wt 265.3 lb

## 2019-06-10 DIAGNOSIS — Z3493 Encounter for supervision of normal pregnancy, unspecified, third trimester: Secondary | ICD-10-CM

## 2019-06-10 LAB — POCT URINALYSIS DIPSTICK OB
Bilirubin, UA: NEGATIVE
Blood, UA: NEGATIVE
Glucose, UA: NEGATIVE
Ketones, UA: NEGATIVE
Leukocytes, UA: NEGATIVE
Nitrite, UA: NEGATIVE
POC,PROTEIN,UA: NEGATIVE
Spec Grav, UA: >=1.03 — AB (ref 1.010–1.025)
Urobilinogen, UA: 0.2 E.U./dL
pH, UA: 6 (ref 5.0–8.0)

## 2019-06-10 NOTE — Patient Instructions (Signed)
[facility]  [facility]  [address], [place], [place] [address]  Phone: [phone]  [facility] (second location)  [address] [place], [place] [address]  Phone: [phone]  [facility]) [address], [place], [place] [address] Phone: [phone]  [facility]  [address] [place], [place] [address]  Phone: [phone] 

## 2019-06-10 NOTE — Progress Notes (Signed)
ROB doing well. Feels fetal movement. Discussed birth plan , she is still reviewing. She will bring it in when complete. All questions answered. Follow up 2 wks.   Philip Aspen, CNM

## 2019-07-02 ENCOUNTER — Ambulatory Visit (INDEPENDENT_AMBULATORY_CARE_PROVIDER_SITE_OTHER): Payer: 59 | Admitting: Certified Nurse Midwife

## 2019-07-02 ENCOUNTER — Other Ambulatory Visit: Payer: Self-pay

## 2019-07-02 ENCOUNTER — Encounter: Payer: Self-pay | Admitting: Certified Nurse Midwife

## 2019-07-02 ENCOUNTER — Encounter: Payer: 59 | Admitting: Obstetrics and Gynecology

## 2019-07-02 VITALS — BP 123/63 | HR 67 | Wt 267.1 lb

## 2019-07-02 DIAGNOSIS — Z3403 Encounter for supervision of normal first pregnancy, third trimester: Secondary | ICD-10-CM

## 2019-07-02 NOTE — Patient Instructions (Signed)
Braxton Hicks Contractions Contractions of the uterus can occur throughout pregnancy, but they are not always a sign that you are in labor. You may have practice contractions called Braxton Hicks contractions. These false labor contractions are sometimes confused with true labor. What are Braxton Hicks contractions? Braxton Hicks contractions are tightening movements that occur in the muscles of the uterus before labor. Unlike true labor contractions, these contractions do not result in opening (dilation) and thinning of the cervix. Toward the end of pregnancy (32-34 weeks), Braxton Hicks contractions can happen more often and may become stronger. These contractions are sometimes difficult to tell apart from true labor because they can be very uncomfortable. You should not feel embarrassed if you go to the hospital with false labor. Sometimes, the only way to tell if you are in true labor is for your health care provider to look for changes in the cervix. The health care provider will do a physical exam and may monitor your contractions. If you are not in true labor, the exam should show that your cervix is not dilating and your water has not broken. If there are no other health problems associated with your pregnancy, it is completely safe for you to be sent home with false labor. You may continue to have Braxton Hicks contractions until you go into true labor. How to tell the difference between true labor and false labor True labor  Contractions last 30-70 seconds.  Contractions become very regular.  Discomfort is usually felt in the top of the uterus, and it spreads to the lower abdomen and low back.  Contractions do not go away with walking.  Contractions usually become more intense and increase in frequency.  The cervix dilates and gets thinner. False labor  Contractions are usually shorter and not as strong as true labor contractions.  Contractions are usually irregular.  Contractions  are often felt in the front of the lower abdomen and in the groin.  Contractions may go away when you walk around or change positions while lying down.  Contractions get weaker and are shorter-lasting as time goes on.  The cervix usually does not dilate or become thin. Follow these instructions at home:   Take over-the-counter and prescription medicines only as told by your health care provider.  Keep up with your usual exercises and follow other instructions from your health care provider.  Eat and drink lightly if you think you are going into labor.  If Braxton Hicks contractions are making you uncomfortable: ? Change your position from lying down or resting to walking, or change from walking to resting. ? Sit and rest in a tub of warm water. ? Drink enough fluid to keep your urine pale yellow. Dehydration may cause these contractions. ? Do slow and deep breathing several times an hour.  Keep all follow-up prenatal visits as told by your health care provider. This is important. Contact a health care provider if:  You have a fever.  You have continuous pain in your abdomen. Get help right away if:  Your contractions become stronger, more regular, and closer together.  You have fluid leaking or gushing from your vagina.  You pass blood-tinged mucus (bloody show).  You have bleeding from your vagina.  You have low back pain that you never had before.  You feel your baby's head pushing down and causing pelvic pressure.  Your baby is not moving inside you as much as it used to. Summary  Contractions that occur before labor are   called Braxton Hicks contractions, false labor, or practice contractions.  Braxton Hicks contractions are usually shorter, weaker, farther apart, and less regular than true labor contractions. True labor contractions usually become progressively stronger and regular, and they become more frequent.  Manage discomfort from Braxton Hicks contractions  by changing position, resting in a warm bath, drinking plenty of water, or practicing deep breathing. This information is not intended to replace advice given to you by your health care provider. Make sure you discuss any questions you have with your health care provider. Document Released: 12/14/2016 Document Revised: 07/13/2017 Document Reviewed: 12/14/2016 Elsevier Patient Education  2020 Elsevier Inc.  

## 2019-07-02 NOTE — Progress Notes (Signed)
Body mass index is 39.44 kg/m.  ROB doing well. State she had a concern about fetal movement the other day , she felt like it was decreased. She states that it has been normal the last 2 days. Reassurance given. Discussed fetal movement and fetal kick counts. FHT difficult to auscultated due to body mass.  Last u/s baby was transverse. Discussed SVE at next visit to confirm presentation and u/s if needed. Also reviewed GBS testing . Packing list given for L&D. She verbalizes and agrees to plan . Follow up 2 wks.   Deneise Lever Braedon Sjogren,CNM

## 2019-07-04 ENCOUNTER — Telehealth: Payer: Self-pay

## 2019-07-04 NOTE — Telephone Encounter (Signed)
Patient called to report she went to the bathroom and when she wiped there was red mucous on the paper. Also there was some in the toilet. Denied recent intercourse. Denied fever. When asked about fetal movement patient states she has been working all morning and hadn't really paid attention. She was advised to drink a large glass of ice water, lay on her left side and count movements. She was asked to put on a pad to monitor spotting. Requested patient send a mychart message in 1 hour to let me know how she is doing. Also advised if things change for the worse to also let us know immediately.

## 2019-07-15 ENCOUNTER — Ambulatory Visit (INDEPENDENT_AMBULATORY_CARE_PROVIDER_SITE_OTHER): Payer: 59 | Admitting: Certified Nurse Midwife

## 2019-07-15 ENCOUNTER — Other Ambulatory Visit: Payer: Self-pay

## 2019-07-15 ENCOUNTER — Encounter: Payer: Self-pay | Admitting: Certified Nurse Midwife

## 2019-07-15 VITALS — BP 114/70 | HR 86 | Wt 273.2 lb

## 2019-07-15 DIAGNOSIS — Z3403 Encounter for supervision of normal first pregnancy, third trimester: Secondary | ICD-10-CM

## 2019-07-15 LAB — POCT URINALYSIS DIPSTICK OB
Bilirubin, UA: NEGATIVE
Blood, UA: NEGATIVE
Glucose, UA: NEGATIVE
Ketones, UA: NEGATIVE
Leukocytes, UA: NEGATIVE
Nitrite, UA: NEGATIVE
POC,PROTEIN,UA: NEGATIVE
Spec Grav, UA: 1.025 (ref 1.010–1.025)
Urobilinogen, UA: 0.2 E.U./dL
pH, UA: 5 (ref 5.0–8.0)

## 2019-07-15 NOTE — Patient Instructions (Signed)

## 2019-07-15 NOTE — Progress Notes (Signed)
Body mass index is 40.35 kg/m. ROB doing well. Discussed induction @ 40 wks diue toe elevated BMI.NST and growth u/s next visit. She verbalizes and agrees to plan of care.  Discussed change in staffing and possibility for guest midwife/MD coverage. Follow up 1 wk   Philip Aspen, CNM

## 2019-07-24 ENCOUNTER — Ambulatory Visit (INDEPENDENT_AMBULATORY_CARE_PROVIDER_SITE_OTHER): Payer: 59

## 2019-07-24 ENCOUNTER — Ambulatory Visit (INDEPENDENT_AMBULATORY_CARE_PROVIDER_SITE_OTHER): Payer: 59 | Admitting: Obstetrics and Gynecology

## 2019-07-24 ENCOUNTER — Encounter: Payer: Self-pay | Admitting: Obstetrics and Gynecology

## 2019-07-24 ENCOUNTER — Other Ambulatory Visit: Payer: Self-pay

## 2019-07-24 DIAGNOSIS — Z3A36 36 weeks gestation of pregnancy: Secondary | ICD-10-CM

## 2019-07-24 DIAGNOSIS — Z362 Encounter for other antenatal screening follow-up: Secondary | ICD-10-CM

## 2019-07-24 DIAGNOSIS — O99210 Obesity complicating pregnancy, unspecified trimester: Secondary | ICD-10-CM

## 2019-07-24 DIAGNOSIS — Z3403 Encounter for supervision of normal first pregnancy, third trimester: Secondary | ICD-10-CM

## 2019-07-24 DIAGNOSIS — O99213 Obesity complicating pregnancy, third trimester: Secondary | ICD-10-CM

## 2019-07-24 NOTE — Patient Instructions (Signed)

## 2019-07-24 NOTE — Progress Notes (Signed)
Patient presents for NST for morbid obesity in pregnancy (BMI 40). NST performed today was reviewed and was found to be reactive.  Continue recommended antenatal testing and prenatal care.  NONSTRESS TEST INTERPRETATION  INDICATIONS: Morbid Obesity  FHR baseline: 140 bpm RESULTS: Reactive (however somewhat discontinuous) COMMENTS: No contractions   PLAN: 1. Continue fetal kick counts twice a day. 2. Continue antepartum testing as scheduled-weekly

## 2019-07-28 ENCOUNTER — Telehealth: Payer: Self-pay

## 2019-07-28 ENCOUNTER — Ambulatory Visit (INDEPENDENT_AMBULATORY_CARE_PROVIDER_SITE_OTHER): Payer: 59 | Admitting: Certified Nurse Midwife

## 2019-07-28 ENCOUNTER — Other Ambulatory Visit: Payer: Self-pay

## 2019-07-28 VITALS — BP 131/54 | HR 85 | Wt 280.0 lb

## 2019-07-28 DIAGNOSIS — Z3A37 37 weeks gestation of pregnancy: Secondary | ICD-10-CM

## 2019-07-28 DIAGNOSIS — Z3493 Encounter for supervision of normal pregnancy, unspecified, third trimester: Secondary | ICD-10-CM

## 2019-07-28 LAB — POCT URINALYSIS DIPSTICK OB
Bilirubin, UA: NEGATIVE
Blood, UA: NEGATIVE
Glucose, UA: NEGATIVE
Ketones, UA: NEGATIVE
Leukocytes, UA: NEGATIVE
Nitrite, UA: NEGATIVE
POC,PROTEIN,UA: NEGATIVE
Spec Grav, UA: 1.01 (ref 1.010–1.025)
Urobilinogen, UA: 0.2 E.U./dL
pH, UA: 6 (ref 5.0–8.0)

## 2019-07-28 NOTE — Progress Notes (Signed)
ROB and NST today for elevated BMI. Reactive, baseline 135, moderate variability, accelerations present, decels absent. Irritability noted. GBS testing today. SVE 1/50/ballatable. Herbal prep hand out given. Follow up 1 wk NST and ROB .   Philip Aspen, CNM

## 2019-07-28 NOTE — Telephone Encounter (Signed)
Pt signed release to authorize disclosure of PHI to [employer] for [facility] & employer requests for info. Scanned.

## 2019-07-28 NOTE — Progress Notes (Signed)
NONSTRESS TEST INTERPRETATION  INDICATIONS:   FHR baseline:  RESULTS: COMMENTS:    PLAN: 1. Continue fetal kick counts twice a day. 2. Continue antepartum testing as scheduled-Biweekly 3    

## 2019-07-28 NOTE — Addendum Note (Signed)
Addended by: Raliegh Ip on: 07/28/2019 11:36 AM   Modules accepted: Orders

## 2019-07-28 NOTE — Patient Instructions (Signed)

## 2019-07-30 LAB — GC/CHLAMYDIA PROBE AMP
Chlamydia trachomatis, NAA: NEGATIVE
Neisseria Gonorrhoeae by PCR: NEGATIVE

## 2019-07-30 LAB — STREP GP B NAA: Strep Gp B NAA: NEGATIVE

## 2019-08-04 ENCOUNTER — Other Ambulatory Visit: Payer: 59

## 2019-08-04 ENCOUNTER — Encounter: Payer: Self-pay | Admitting: Certified Nurse Midwife

## 2019-08-04 ENCOUNTER — Ambulatory Visit (INDEPENDENT_AMBULATORY_CARE_PROVIDER_SITE_OTHER): Payer: 59 | Admitting: Certified Nurse Midwife

## 2019-08-04 ENCOUNTER — Other Ambulatory Visit: Payer: Self-pay

## 2019-08-04 VITALS — BP 120/66 | HR 82 | Wt 280.1 lb

## 2019-08-04 DIAGNOSIS — Z3403 Encounter for supervision of normal first pregnancy, third trimester: Secondary | ICD-10-CM

## 2019-08-04 LAB — POCT URINALYSIS DIPSTICK OB
Bilirubin, UA: NEGATIVE
Blood, UA: NEGATIVE
Glucose, UA: NEGATIVE
Ketones, UA: NEGATIVE
Leukocytes, UA: NEGATIVE
Nitrite, UA: NEGATIVE
POC,PROTEIN,UA: NEGATIVE
Spec Grav, UA: 1.01 (ref 1.010–1.025)
Urobilinogen, UA: 0.2 E.U./dL
pH, UA: 5 (ref 5.0–8.0)

## 2019-08-04 NOTE — Patient Instructions (Signed)
Labor Induction  Labor induction is when steps are taken to cause a pregnant woman to begin the labor process. Most women go into labor on their own between 37 weeks and 42 weeks of pregnancy. When this does not happen or when there is a medical need for labor to begin, steps may be taken to induce labor. Labor induction causes a pregnant woman's uterus to contract. It also causes the cervix to soften (ripen), open (dilate), and thin out (efface). Usually, labor is not induced before 39 weeks of pregnancy unless there is a medical reason to do so. Your health care provider will determine if labor induction is needed. Before inducing labor, your health care provider will consider a number of factors, including:  Your medical condition and your baby's.  How many weeks along you are in your pregnancy.  How mature your baby's lungs are.  The condition of your cervix.  The position of your baby.  The size of your birth canal. What are some reasons for labor induction? Labor may be induced if:  Your health or your baby's health is at risk.  Your pregnancy is overdue by 1 week or more.  Your water breaks but labor does not start on its own.  There is a low amount of amniotic fluid around your baby. You may also choose (elect) to have labor induced at a certain time. Generally, elective labor induction is done no earlier than 39 weeks of pregnancy. What methods are used for labor induction? Methods used for labor induction include:  Prostaglandin medicine. This medicine starts contractions and causes the cervix to dilate and ripen. It can be taken by mouth (orally) or by being inserted into the vagina (suppository).  Inserting a small, thin tube (catheter) with a balloon into the vagina and then expanding the balloon with water to dilate the cervix.  Stripping the membranes. In this method, your health care provider gently separates amniotic sac tissue from the cervix. This causes the  cervix to stretch, which in turn causes the release of a hormone called progesterone. The hormone causes the uterus to contract. This procedure is often done during an office visit, after which you will be sent home to wait for contractions to begin.  Breaking the water. In this method, your health care provider uses a small instrument to make a small hole in the amniotic sac. This eventually causes the amniotic sac to break. Contractions should begin after a few hours.  Medicine to trigger or strengthen contractions. This medicine is given through an IV that is inserted into a vein in your arm. Except for membrane stripping, which can be done in a clinic, labor induction is done in the hospital so that you and your baby can be carefully monitored. How long does it take for labor to be induced? The length of time it takes to induce labor depends on how ready your body is for labor. Some inductions can take up to 2-3 days, while others may take less than a day. Induction may take longer if:  You are induced early in your pregnancy.  It is your first pregnancy.  Your cervix is not ready. What are some risks associated with labor induction? Some risks associated with labor induction include:  Changes in fetal heart rate, such as being too high, too low, or irregular (erratic).  Failed induction.  Infection in the mother or the baby.  Increased risk of having a cesarean delivery.  Fetal death.  Breaking off (abruption)   of the placenta from the uterus (rare).  Rupture of the uterus (very rare). When induction is needed for medical reasons, the benefits of induction generally outweigh the risks. What are some reasons for not inducing labor? Labor induction should not be done if:  Your baby does not tolerate contractions.  You have had previous surgeries on your uterus, such as a myomectomy, removal of fibroids, or a vertical scar from a previous cesarean delivery.  Your placenta lies  very low in your uterus and blocks the opening of the cervix (placenta previa).  Your baby is not in a head-down position.  The umbilical cord drops down into the birth canal in front of the baby.  There are unusual circumstances, such as the baby being very early (premature).  You have had more than 2 previous cesarean deliveries. Summary  Labor induction is when steps are taken to cause a pregnant woman to begin the labor process.  Labor induction causes a pregnant woman's uterus to contract. It also causes the cervix to ripen, dilate, and efface.  Labor is not induced before 39 weeks of pregnancy unless there is a medical reason to do so.  When induction is needed for medical reasons, the benefits of induction generally outweigh the risks. This information is not intended to replace advice given to you by your health care provider. Make sure you discuss any questions you have with your health care provider. Document Released: 12/20/2006 Document Revised: 08/03/2017 Document Reviewed: 09/13/2016 Elsevier Patient Education  [employer]. Nonstress Test A nonstress test is a procedure that is done during pregnancy in order to check the baby's heartbeat. The procedure can help show if the baby (fetus) is healthy. It is commonly done when:  The baby is past his or her due date.  The pregnancy is high risk.  The baby is moving less than normal.  The mother has lost a pregnancy in the past.  The health care provider suspects a problem with the baby's growth.  There is too much or too little amniotic fluid. The procedure is often done in the third trimester of pregnancy to find out if an early delivery is needed and whether such a delivery is safe. During a nonstress test, the baby's heartbeat is monitored when the baby is resting and when the baby is moving. If the baby is healthy, the heart rate will increase when he or she moves or kicks and will return to normal when he or  she rests. Tell a health care provider about:  Any allergies you have.  Any medical conditions you have.  All medicines you are taking, including vitamins, herbs, eye drops, creams, and over-the-counter medicines. What are the risks? There are no risks to you or your baby from a nonstress test. This procedure should not be painful or uncomfortable. What happens before the procedure?  Eat a meal right before the test or as directed by your health care provider. Food may help encourage the baby to move.  Use the restroom right before the test. What happens during the procedure?  Two monitors will be placed on your abdomen. One will record the baby's heart rate and the other will record the contractions of your uterus.  You may be asked to lie down on your side or to sit upright.  You may be given a button to press when you feel your baby move.  Your health care provider will listen to your baby's heartbeat and recorded it. He or she  may also watch your baby's heartbeat on a screen.  If the baby seems to be sleeping, you may be asked to drink some juice or soda, eat a snack, or change positions. The procedure may vary among health care providers and hospitals. What happens after the procedure?  Your health care provider will discuss the test results with you and make recommendations for the future. Depending on the results, your health care provider may order additional tests or another course of action.  If your health care provider gave you any diet or activity instructions, make sure to follow them.  Keep all follow-up visits as told by your health care provider. This is important. Summary  A nonstress test is a procedure that is done during pregnancy in order to check the baby's heartbeat. The procedure can help show if the baby is healthy.  The procedure is often done in the third trimester of pregnancy to find out if an early delivery is needed and whether such a delivery is  safe.  During a nonstress test, the baby's heartbeat is monitored when the baby is resting and when the baby is moving. If the baby is healthy, the heart rate will increase when he or she moves or kicks and will return to normal when he or she rests.  Your health care provider will discuss the test results with you and make recommendations for the future. This information is not intended to replace advice given to you by your health care provider. Make sure you discuss any questions you have with your health care provider. Document Released: 07/21/2002 Document Revised: 11/09/2016 Document Reviewed: 11/09/2016 Elsevier Patient Education  2020 [employer].

## 2019-08-04 NOTE — Addendum Note (Signed)
Addended by: Raliegh Ip on: 08/04/2019 12:14 PM   Modules accepted: Orders

## 2019-08-04 NOTE — Progress Notes (Addendum)
NST performed today was reviewed and was found to be reactive. Baseline 130 with Moderate variability; No decels noted. Accelerations present.  Discussed induction 08/13/19 @ 0000. Discussed foley bulb procedure on the 29ths.  SVE 1cm/thick/ballatable. She verbalizes and agrees to plan of care. Body mass index is 41.37 kg/m.   Continue recommended antenatal testing and prenatal care.  Philip Aspen, CNM

## 2019-08-04 NOTE — Progress Notes (Signed)
NONSTRESS TEST INTERPRETATION  INDICATIONS:   FHR baseline:  RESULTS: COMMENTS:    PLAN: 1. Continue fetal kick counts twice a day. 2. Continue antepartum testing as scheduled-Biweekly 3    

## 2019-08-11 ENCOUNTER — Other Ambulatory Visit
Admission: RE | Admit: 2019-08-11 | Discharge: 2019-08-11 | Disposition: A | Discharge status: Home / Self Care | Payer: 59 | Source: Ambulatory Visit | Attending: Certified Nurse Midwife | Admitting: Certified Nurse Midwife

## 2019-08-11 ENCOUNTER — Other Ambulatory Visit: Payer: Self-pay

## 2019-08-12 ENCOUNTER — Encounter: Payer: Self-pay | Admitting: Certified Nurse Midwife

## 2019-08-12 ENCOUNTER — Other Ambulatory Visit: Payer: 59

## 2019-08-12 ENCOUNTER — Ambulatory Visit (INDEPENDENT_AMBULATORY_CARE_PROVIDER_SITE_OTHER): Payer: 59 | Admitting: Certified Nurse Midwife

## 2019-08-12 ENCOUNTER — Encounter: Payer: 59 | Admitting: Certified Nurse Midwife

## 2019-08-12 DIAGNOSIS — Z3A39 39 weeks gestation of pregnancy: Secondary | ICD-10-CM | POA: Diagnosis not present

## 2019-08-12 DIAGNOSIS — Z3403 Encounter for supervision of normal first pregnancy, third trimester: Secondary | ICD-10-CM | POA: Diagnosis not present

## 2019-08-12 LAB — SARS CORONAVIRUS 2 (TAT 6-24 HRS): SARS Coronavirus 2: NEGATIVE

## 2019-08-12 NOTE — Patient Instructions (Signed)

## 2019-08-12 NOTE — Progress Notes (Signed)
NONSTRESS TEST INTERPRETATION  INDICATIONS:   FHR baseline:  RESULTS: COMMENTS:   PLAN: 1. Continue fetal kick counts twice a day. 2. Continue antepartum testing as scheduled-Biweekly 3.  Logon Uttech, LPN  

## 2019-08-12 NOTE — Progress Notes (Signed)
FOLEY BULB PROCEDURE NOTE:    I have verified that the patient is an appropriate candidate for outpatient foley bulb placement.  She has consented to foley bulb placement today. She has no concerns at this time. A pre-procedure NST performed today was reviewed and was found to be reactive.     The patient was placed in the dorsal lithotomy position.  A vaginal examination was performed and a finger was placed at the base of the cervical os. Was unable to reach internal os.  A speculum was inserted into the vagina and the cervix was visualized   The cervix was noted to be approx 1 cm dilated  A foley catheter was advanced into the cervix slightly pass the internal cervical os.  The foley balloon was filled but the catheter would slikp out of the cervix , I was unable to get it inserted all the way due to maternal body habitus. .  The patient tolerated the procedure well.  A post-procedure NST was performed. Sterile technique was observed throughout.      NONSTRESS TEST INTERPRETATION  INDICATIONS: Foley bulb induction placement not successful   FHR baseline: 130 bpm (pre-procedure).  RESULTS:Reactive x 2.  COMMENTS: reviewed labor precautions, SROM, bleeding    PLAN: 1. To arrive at Labor and Delivery for scheduled induction of labor @ midnight.  She was given post-procedure instructions.

## 2019-08-13 ENCOUNTER — Encounter: Payer: Self-pay | Admitting: Certified Nurse Midwife

## 2019-08-13 ENCOUNTER — Inpatient Hospital Stay
Admission: RE | Admit: 2019-08-13 | Discharge: 2019-08-15 | DRG: 807 | Disposition: A | Discharge status: Home / Self Care | Payer: 59 | Attending: Certified Nurse Midwife | Admitting: Certified Nurse Midwife

## 2019-08-13 ENCOUNTER — Other Ambulatory Visit: Payer: Self-pay

## 2019-08-13 ENCOUNTER — Inpatient Hospital Stay: Payer: 59 | Admitting: Registered Nurse

## 2019-08-13 DIAGNOSIS — O99214 Obesity complicating childbirth: Secondary | ICD-10-CM | POA: Diagnosis present

## 2019-08-13 DIAGNOSIS — Z20822 Contact with and (suspected) exposure to covid-19: Secondary | ICD-10-CM | POA: Diagnosis present

## 2019-08-13 DIAGNOSIS — O326XX0 Maternal care for compound presentation, not applicable or unspecified: Secondary | ICD-10-CM | POA: Diagnosis present

## 2019-08-13 DIAGNOSIS — E669 Obesity, unspecified: Secondary | ICD-10-CM | POA: Diagnosis present

## 2019-08-13 DIAGNOSIS — Z3A39 39 weeks gestation of pregnancy: Secondary | ICD-10-CM

## 2019-08-13 LAB — CBC
HCT: 35.8 % — ABNORMAL LOW (ref 36.0–46.0)
Hemoglobin: 12.2 g/dL (ref 12.0–15.0)
MCH: 29.6 pg (ref 26.0–34.0)
MCHC: 34.1 g/dL (ref 30.0–36.0)
MCV: 86.9 fL (ref 80.0–100.0)
Platelets: 297 10*3/uL (ref 150–400)
RBC: 4.12 MIL/uL (ref 3.87–5.11)
RDW: 13.4 % (ref 11.5–15.5)
WBC: 10.2 10*3/uL (ref 4.0–10.5)
nRBC: 0 % (ref 0.0–0.2)

## 2019-08-13 LAB — ABO/RH: ABO/RH(D): O POS

## 2019-08-13 LAB — RPR: RPR Ser Ql: NONREACTIVE

## 2019-08-13 LAB — TYPE AND SCREEN
ABO/RH(D): O POS
Antibody Screen: NEGATIVE

## 2019-08-13 MED ORDER — OXYTOCIN 40 UNITS IN NORMAL SALINE INFUSION - SIMPLE MED
1.0000 m[IU]/min | INTRAVENOUS | Status: DC
  Administered 2019-08-13: 4 m[IU]/min via INTRAVENOUS

## 2019-08-13 MED ORDER — FENTANYL 2.5 MCG/ML W/ROPIVACAINE 0.15% IN NS 100 ML EPIDURAL (ARMC)
EPIDURAL | Status: AC
  Filled 2019-08-13: qty 100

## 2019-08-13 MED ORDER — OXYTOCIN BOLUS FROM INFUSION: 500.0000 mL | Freq: Once | INTRAVENOUS | Status: AC

## 2019-08-13 MED ORDER — TERBUTALINE SULFATE 1 MG/ML IJ SOLN: 0.2500 mg | Freq: Once | INTRAMUSCULAR | Status: DC | PRN

## 2019-08-13 MED ORDER — BUPIVACAINE HCL (PF) 0.25 % IJ SOLN
INTRAMUSCULAR | Status: DC | PRN
  Administered 2019-08-13 (×2): 4 mL via EPIDURAL

## 2019-08-13 MED ORDER — ONDANSETRON HCL 4 MG/2ML IJ SOLN: 4.0000 mg | Freq: Four times a day (QID) | INTRAMUSCULAR | Status: DC | PRN

## 2019-08-13 MED ORDER — LIDOCAINE-EPINEPHRINE (PF) 1.5 %-1:200000 IJ SOLN
INTRAMUSCULAR | Status: DC | PRN
  Administered 2019-08-13: 3 mL via PERINEURAL

## 2019-08-13 MED ORDER — MISOPROSTOL 100 MCG PO TABS
50.0000 ug | ORAL_TABLET | ORAL | Status: DC
  Administered 2019-08-13 (×2): 50 ug via VAGINAL
  Filled 2019-08-13 (×12): qty 1

## 2019-08-13 MED ORDER — BUTORPHANOL TARTRATE 1 MG/ML IJ SOLN: 1.0000 mg | INTRAMUSCULAR | Status: DC | PRN

## 2019-08-13 MED ORDER — MISOPROSTOL 200 MCG PO TABS
ORAL_TABLET | ORAL | Status: AC
  Administered 2019-08-13: 50 ug via VAGINAL
  Filled 2019-08-13: qty 4

## 2019-08-13 MED ORDER — FENTANYL 2.5 MCG/ML W/ROPIVACAINE 0.15% IN NS 100 ML EPIDURAL (ARMC)
EPIDURAL | Status: DC | PRN
  Administered 2019-08-13: 12 mL/h via EPIDURAL
  Administered 2019-08-13 – 2019-08-14 (×2): 250 ug via EPIDURAL

## 2019-08-13 MED ORDER — OXYTOCIN 40 UNITS IN NORMAL SALINE INFUSION - SIMPLE MED
2.5000 [IU]/h | INTRAVENOUS | Status: DC
  Filled 2019-08-13: qty 1000

## 2019-08-13 MED ORDER — OXYTOCIN 40 UNITS IN NORMAL SALINE INFUSION - SIMPLE MED
INTRAVENOUS | Status: AC
  Administered 2019-08-14: 500 mL via INTRAVENOUS
  Filled 2019-08-13: qty 1000

## 2019-08-13 MED ORDER — AMMONIA AROMATIC IN INHA
RESPIRATORY_TRACT | Status: AC
  Filled 2019-08-13: qty 10

## 2019-08-13 MED ORDER — SOD CITRATE-CITRIC ACID 500-334 MG/5ML PO SOLN: 30.0000 mL | ORAL | Status: DC | PRN

## 2019-08-13 MED ORDER — LIDOCAINE HCL (PF) 1 % IJ SOLN
INTRAMUSCULAR | Status: AC
  Filled 2019-08-13: qty 30

## 2019-08-13 MED ORDER — LACTATED RINGERS IV SOLN: 500.0000 mL | INTRAVENOUS | Status: DC | PRN

## 2019-08-13 MED ORDER — ACETAMINOPHEN 325 MG PO TABS
650.0000 mg | ORAL_TABLET | ORAL | Status: DC | PRN
  Administered 2019-08-14 (×2): 650 mg via ORAL
  Filled 2019-08-13 (×3): qty 2

## 2019-08-13 MED ORDER — LACTATED RINGERS IV SOLN: INTRAVENOUS | Status: DC

## 2019-08-13 MED ORDER — OXYTOCIN 10 UNIT/ML IJ SOLN
INTRAMUSCULAR | Status: AC
  Filled 2019-08-13: qty 2

## 2019-08-13 MED ORDER — LIDOCAINE HCL (PF) 1 % IJ SOLN
30.0000 mL | INTRAMUSCULAR | Status: AC | PRN
  Administered 2019-08-13: 2 mL via SUBCUTANEOUS

## 2019-08-13 NOTE — Anesthesia Preprocedure Evaluation (Signed)
Anesthesia Evaluation    Reviewed: Allergy & Precautions, H&P , NPO status , Patient's Chart, lab work & pertinent test results  Airway Mallampati: II  TM Distance: >3 FB Neck ROM: full    Dental no notable dental hx.    Pulmonary neg pulmonary ROS,    Pulmonary exam normal        Cardiovascular negative cardio ROS Normal cardiovascular exam     Neuro/Psych negative neurological ROS  negative psych ROS   GI/Hepatic negative GI ROS, Neg liver ROS,   Endo/Other  negative endocrine ROS  Renal/GU negative Renal ROS  negative genitourinary   Musculoskeletal   Abdominal   Peds  Hematology negative hematology ROS (+)   Anesthesia Other Findings   Reproductive/Obstetrics (+) Pregnancy                             Anesthesia Physical Anesthesia Plan  ASA: II  Anesthesia Plan:    Post-op Pain Management:    Induction:   PONV Risk Score and Plan:   Airway Management Planned:   Additional Equipment:   Intra-op Plan:   Post-operative Plan:   Informed Consent: I have reviewed the patients History and Physical, chart, labs and discussed the procedure including the risks, benefits and alternatives for the proposed anesthesia with the patient or authorized representative who has indicated his/her understanding and acceptance.     Dental Advisory Given  Plan Discussed with: Anesthesiologist and CRNA  Anesthesia Plan Comments:         Anesthesia Quick Evaluation

## 2019-08-13 NOTE — Progress Notes (Signed)
LABOR NOTE   Kristina Kelly 23 y.o.@ at [redacted]w[redacted]d Not in labor.  SUBJECTIVE:  Mild cramping OBJECTIVE:  BP (!) 120/54 (BP Location: Right Arm)   Pulse 74   Temp 98.5 F (36.9 C) (Oral)   Resp 18   Ht 5\' 9"  (1.753 m)   Wt 127 kg   LMP 11/09/2018   BMI 41.35 kg/m  No intake/output data recorded.  She has shown cervical change. CERVIX: 3 cm:  50%:   -3:   posterior:   firm SVE:   Dilation: 3 Effacement (%): 50 Station: -3 Exam by:: Philip Aspen CONTRACTIONS: regular, every 2-3 minutes FHR: Fetal heart tracing reviewed. Baseline: 130 bpm, Variability: Good {> 6 bpm), Accelerations: Reactive and Decelerations: variable  Category II   Analgesia: Labor support without medications  Labs: Lab Results  Component Value Date   WBC 10.2 08/13/2019   HGB 12.2 08/13/2019   HCT 35.8 (L) 08/13/2019   MCV 86.9 08/13/2019   PLT 297 08/13/2019    ASSESSMENT: 1) Labor curve reviewed.       Progress: Not in labor.     Membranes: intact          Active Problems:   Labor and delivery, indication for care   PLAN: continue present management   Philip Aspen, CNM 08/13/2019 12:04 PM

## 2019-08-13 NOTE — Anesthesia Procedure Notes (Addendum)
Epidural Patient location during procedure: OB Start time: 08/13/2019 3:37 PM End time: 08/13/2019 3:41 PM  Staffing Anesthesiologist: Gunnar Fusi, MD Resident/CRNA: Hedda Slade, CRNA Performed: resident/CRNA   Preanesthetic Checklist Completed: patient identified, IV checked, site marked, risks and benefits discussed, surgical consent, monitors and equipment checked, pre-op evaluation and timeout performed  Epidural Patient position: sitting Prep: ChloraPrep Patient monitoring: heart rate, continuous pulse ox and blood pressure Approach: midline Location: L3-L4 Injection technique: LOR saline  Needle:  Needle type: Tuohy  Needle gauge: 17 G Needle length: 9 cm and 9 Needle insertion depth: 8 cm Catheter type: closed end flexible Catheter size: 19 Gauge Catheter at skin depth: 13 cm Test dose: negative and 1.5% lidocaine with Epi 1:200 K  Assessment Sensory level: T10 Events: blood not aspirated, injection not painful, no injection resistance, no paresthesia and negative IV test  Additional Notes 1 attempt Pt. Evaluated and documentation done after procedure finished. Patient identified. Risks/Benefits/Options discussed with patient including but not limited to bleeding, infection, nerve damage, paralysis, failed block, incomplete pain control, headache, blood pressure changes, nausea, vomiting, reactions to medication both or allergic, itching and postpartum back pain. Confirmed with bedside nurse the patient's most recent platelet count. Confirmed with patient that they are not currently taking any anticoagulation, have any bleeding history or any family history of bleeding disorders. Patient expressed understanding and wished to proceed. All questions were answered. Sterile technique was used throughout the entire procedure. Please see nursing notes for vital signs. Test dose was given through epidural catheter and negative prior to continuing to dose epidural or  start infusion. Warning signs of high block given to the patient including shortness of breath, tingling/numbness in hands, complete motor block, or any concerning symptoms with instructions to call for help. Patient was given instructions on fall risk and not to get out of bed. All questions and concerns addressed with instructions to call with any issues or inadequate analgesia.   Patient tolerated the insertion well without immediate complications.Reason for block:procedure for pain

## 2019-08-13 NOTE — H&P (Signed)
History and Physical   HPI  Kristina Kelly is a 23 y.o. G1P0 at [redacted]w[redacted]d Estimated Date of Delivery: 08/16/19 who is being admitted for induction of labor  due to elevated BMI in pregnancy.    OB History  OB History  Gravida Para Term Preterm AB Living  1 0 0 0 0 0  SAB TAB Ectopic Multiple Live Births  0 0 0 0 0    # Outcome Date GA Lbr Len/2nd Weight Sex Delivery Anes PTL Lv  1 Current             PROBLEM LIST  Pregnancy complications or risks: Patient Active Problem List   Diagnosis Date Noted  . Labor and delivery, indication for care 08/13/2019  . Constipated 04/29/2019  . Fatigue 07/05/2017  . Obesity (BMI 30-39.9) 07/05/2017     Prenatal labs and studies: ABO, Rh: --/--/O POS Performed at [facility], [address] [place], [place] [address]  [phone]) Antibody: NEG (12/30 0118) Rubella: 2.41 (06/04 1437) RPR: Non Reactive (10/13 0956)  HBsAg: Negative (06/04 1437)  HIV: Non Reactive (06/04 1437)  GBS:--/Negative (12/14 1632)   History reviewed. No pertinent past medical history.   Past Surgical History:  Procedure Laterality Date  . TONSILLECTOMY       Medications    Current Discharge Medication List    CONTINUE these medications which have NOT CHANGED   Details  Docosahexaenoic Acid (DHA COMPLETE PO) Take by mouth.    Prenatal Vit-Fe Fumarate-FA (PRENATAL MULTIVITAMIN) TABS tablet Take 1 tablet by mouth daily at 12 noon.    psyllium (METAMUCIL) 58.6 % packet Take 1 packet by mouth daily.         Allergies  Patient has no known allergies.  Review of Systems  Constitutional: negative Eyes: negative Ears, nose, mouth, throat, and face: negative Respiratory: negative Cardiovascular: negative Gastrointestinal: negative Genitourinary:negative Integument/breast: negative Hematologic/lymphatic: negative Musculoskeletal:negative Neurological: negative Behavioral/Psych: negative Endocrine:  negative Allergic/Immunologic: negative  Physical Exam  BP (!) 146/84   Pulse 77   Temp 98.1 F (36.7 C) (Oral)   Resp 18   Ht 5\' 9"  (1.753 m)   Wt 127 kg   LMP 11/09/2018   BMI 41.35 kg/m   Lungs:  CTA B Cardio: RRR without M/R/G Abd: Soft, gravid, NT Presentation: cephalic EXT: No C/C/ 1+ Edema DTRs: 2+ B CERVIX: Dilation: 1 Effacement (%): 50 Cervical Position: Posterior Station: Ballotable Presentation: Vertex Exam by:: Collene Leyden, RN   See Prenatal records for more detailed PE.     FHR:  Baseline: 140 bpm, Variability: Good {> 6 bpm), Accelerations: Non-reactive but appropriate for gestational age and Decelerations: suspected early, difficult to assess due to maternal body habitus  Toco: Uterine Contractions: irritability    Test Results  Results for orders placed or performed during the hospital encounter of 08/13/19 (from the past 24 hour(s))  CBC     Status: Abnormal   Collection Time: 08/13/19  1:18 AM  Result Value Ref Range   WBC 10.2 4.0 - 10.5 K/uL   RBC 4.12 3.87 - 5.11 MIL/uL   Hemoglobin 12.2 12.0 - 15.0 g/dL   HCT 35.8 (L) 36.0 - 46.0 %   MCV 86.9 80.0 - 100.0 fL   MCH 29.6 26.0 - 34.0 pg   MCHC 34.1 30.0 - 36.0 g/dL   RDW 13.4 11.5 - 15.5 %   Platelets 297 150 - 400 K/uL   nRBC 0.0 0.0 - 0.2 %  Type and screen     Status: None   Collection Time: 08/13/19  1:18 AM  Result Value Ref Range   ABO/RH(D) O POS    Antibody Screen NEG    Sample Expiration      08/16/2019,2359 Performed at [facility], [address] [place] [place], [place] [address]   ABO/Rh     Status: None   Collection Time: 08/13/19  5:18 AM  Result Value Ref Range   ABO/RH(D)      O POS Performed at [facility], [address] [place], [place] [address]    Group B Strep negative  Assessment   G1P0 at [redacted]w[redacted]d Estimated Date of Delivery: 08/16/19  The fetus is reassuring.    Patient Active Problem List   Diagnosis Date Noted  . Labor and  delivery, indication for care 08/13/2019  . Constipated 04/29/2019  . Fatigue 07/05/2017  . Obesity (BMI 30-39.9) 07/05/2017    Plan  1. Admit to L&D :   continue present management 2. EFM:-- Category 2 3. Epidural if desired.  Stadol for IV pain until epidural requested. 4. Admission labs completed   Doreene Burke, [facility]  08/13/2019 7:58 AM

## 2019-08-13 NOTE — Progress Notes (Signed)
LABOR NOTE   Kristina Kelly 23 y.o.@ at [redacted]w[redacted]d Early latent labor.  SUBJECTIVE:  Comfortable with epidural.  OBJECTIVE:  BP (!) 120/54 (BP Location: Right Arm)   Pulse 74   Temp 98.7 F (37.1 C) (Oral)   Resp 18   Ht 5\' 9"  (1.753 m)   Wt 127 kg   LMP 11/09/2018   BMI 41.35 kg/m  Total I/O In: -  Out: 300 [Urine:300]  She has nat shown cervical change. CERVIX: 3 cm:  70%:   -2:   mid position:   firm SVE:   Dilation: 3 Effacement (%): 70 Station: -2 Exam by:: Philip Aspen, CNM CONTRACTIONS: regular, every 2 minutes FHR: Fetal heart tracing reviewed. Baseline: 130 bpm, Variability: Good {> 6 bpm), Accelerations: Reactive and Decelerations: Absent Category I  Analgesia: Epidural  Labs: Lab Results  Component Value Date   WBC 10.2 08/13/2019   HGB 12.2 08/13/2019   HCT 35.8 (L) 08/13/2019   MCV 86.9 08/13/2019   PLT 297 08/13/2019    ASSESSMENT: 1) Labor curve reviewed.       Progress: Prolonged latent labor.     Membranes: ruptured, clear fluid           Active Problems:   Labor and delivery, indication for care   PLAN: IV Pitocin augmentation Dr.Evans consulted on plan of care. Pt has made no cervical change since the last exam. Pitocin augmentation recommended. Start per protocol   Philip Aspen, CNM 08/13/2019 9:39 PM

## 2019-08-13 NOTE — Progress Notes (Signed)
Patient arrived in LDR 3 for scheduled induction of labor. Reports good fetal movement and some mild contractions. Denies leaking of fluid or vaginal bleeding. Monitors applied and assessing. Initial fhr 130.

## 2019-08-13 NOTE — Progress Notes (Signed)
LABOR NOTE   Kristina Kelly 23 y.o.@ at [redacted]w[redacted]d Prolonged latent labor.  SUBJECTIVE:  Comfortable with Epidural  OBJECTIVE:  BP (!) 120/54 (BP Location: Right Arm)   Pulse 74   Temp 97.8 F (36.6 C) (Oral)   Resp 18   Ht 5\' 9"  (1.753 m)   Wt 127 kg   LMP 11/09/2018   BMI 41.35 kg/m  No intake/output data recorded.  She has not shown cervical change. CERVIX: 3 cm:  70%:   -3:   posterior:   firm SVE:   Dilation: 3 Effacement (%): 70 Station: -3 Exam by:: Philip Aspen CONTRACTIONS: regular, every 1-2 minutes FHR: Fetal heart tracing reviewed. Baseline: 120 bpm, Variability: Good {> 6 bpm), Accelerations: Reactive and Decelerations: Absent Category I   Analgesia: Epidural  Labs: Lab Results  Component Value Date   WBC 10.2 08/13/2019   HGB 12.2 08/13/2019   HCT 35.8 (L) 08/13/2019   MCV 86.9 08/13/2019   PLT 297 08/13/2019    ASSESSMENT: 1) Labor curve reviewed.       Progress: Early latent labor.     Membranes: ruptured, clear fluid, IUPC placed            Active Problems:   Labor and delivery, indication for care   PLAN: continue present management   Philip Aspen, CNM  08/13/2019 4:54 PM

## 2019-08-14 ENCOUNTER — Encounter: Payer: Self-pay | Admitting: Obstetrics and Gynecology

## 2019-08-14 DIAGNOSIS — Z3A39 39 weeks gestation of pregnancy: Secondary | ICD-10-CM

## 2019-08-14 MED ORDER — SIMETHICONE 80 MG PO CHEW: 80.0000 mg | CHEWABLE_TABLET | ORAL | Status: DC | PRN

## 2019-08-14 MED ORDER — SENNOSIDES-DOCUSATE SODIUM 8.6-50 MG PO TABS
2.0000 | ORAL_TABLET | ORAL | Status: DC
  Administered 2019-08-14: 2 via ORAL
  Filled 2019-08-14: qty 2

## 2019-08-14 MED ORDER — SODIUM CHLORIDE 0.9 % IV SOLN
INTRAVENOUS | Status: AC
  Administered 2019-08-14: 2 g via INTRAVENOUS
  Filled 2019-08-14: qty 2000

## 2019-08-14 MED ORDER — METHYLERGONOVINE MALEATE 0.2 MG PO TABS
0.2000 mg | ORAL_TABLET | ORAL | Status: DC | PRN
  Filled 2019-08-14: qty 1

## 2019-08-14 MED ORDER — FERROUS SULFATE 325 (65 FE) MG PO TABS
325.0000 mg | ORAL_TABLET | Freq: Every day | ORAL | Status: DC
  Administered 2019-08-15: 325 mg via ORAL
  Filled 2019-08-14: qty 1

## 2019-08-14 MED ORDER — SODIUM CHLORIDE 0.9 % IV SOLN: 2.0000 g | Freq: Once | INTRAVENOUS | Status: AC

## 2019-08-14 MED ORDER — ONDANSETRON HCL 4 MG PO TABS: 4.0000 mg | ORAL_TABLET | ORAL | Status: DC | PRN

## 2019-08-14 MED ORDER — BENZOCAINE-MENTHOL 20-0.5 % EX AERO
1.0000 "application " | INHALATION_SPRAY | CUTANEOUS | Status: DC | PRN
  Administered 2019-08-14 – 2019-08-15 (×2): 1 via TOPICAL
  Filled 2019-08-14 (×2): qty 56

## 2019-08-14 MED ORDER — OXYCODONE-ACETAMINOPHEN 5-325 MG PO TABS: 2.0000 | ORAL_TABLET | ORAL | Status: DC | PRN

## 2019-08-14 MED ORDER — IBUPROFEN 600 MG PO TABS
600.0000 mg | ORAL_TABLET | Freq: Four times a day (QID) | ORAL | Status: DC
  Administered 2019-08-14 – 2019-08-15 (×5): 600 mg via ORAL
  Filled 2019-08-14 (×5): qty 1

## 2019-08-14 MED ORDER — METHYLERGONOVINE MALEATE 0.2 MG/ML IJ SOLN: 0.2000 mg | INTRAMUSCULAR | Status: DC | PRN

## 2019-08-14 MED ORDER — LACTATED RINGERS IV SOLN: INTRAVENOUS | Status: DC

## 2019-08-14 MED ORDER — DOCUSATE SODIUM 100 MG PO CAPS
100.0000 mg | ORAL_CAPSULE | Freq: Two times a day (BID) | ORAL | Status: DC
  Administered 2019-08-14 – 2019-08-15 (×2): 100 mg via ORAL
  Filled 2019-08-14 (×2): qty 1

## 2019-08-14 MED ORDER — COCONUT OIL OIL
1.0000 "application " | TOPICAL_OIL | Status: DC | PRN
  Administered 2019-08-15: 1 via TOPICAL
  Filled 2019-08-14: qty 120

## 2019-08-14 MED ORDER — FENTANYL 2.5 MCG/ML W/ROPIVACAINE 0.15% IN NS 100 ML EPIDURAL (ARMC)
EPIDURAL | Status: AC
  Filled 2019-08-14: qty 100

## 2019-08-14 MED ORDER — PRENATAL MULTIVITAMIN CH
1.0000 | ORAL_TABLET | Freq: Every day | ORAL | Status: DC
  Administered 2019-08-14 – 2019-08-15 (×2): 1 via ORAL
  Filled 2019-08-14 (×2): qty 1

## 2019-08-14 MED ORDER — DIBUCAINE (PERIANAL) 1 % EX OINT: 1.0000 "application " | TOPICAL_OINTMENT | CUTANEOUS | Status: DC | PRN

## 2019-08-14 MED ORDER — ACETAMINOPHEN 325 MG PO TABS
650.0000 mg | ORAL_TABLET | ORAL | Status: DC | PRN
  Administered 2019-08-14: 650 mg via ORAL

## 2019-08-14 MED ORDER — LIDOCAINE HCL (PF) 1 % IJ SOLN: 30.0000 mL | INTRAMUSCULAR | Status: DC | PRN

## 2019-08-14 MED ORDER — ONDANSETRON HCL 4 MG/2ML IJ SOLN: 4.0000 mg | INTRAMUSCULAR | Status: DC | PRN

## 2019-08-14 MED ORDER — WITCH HAZEL-GLYCERIN EX PADS
1.0000 "application " | MEDICATED_PAD | CUTANEOUS | Status: DC | PRN
  Administered 2019-08-15: 1 via TOPICAL
  Filled 2019-08-14: qty 100

## 2019-08-14 MED ORDER — SODIUM CHLORIDE 0.9 % IV SOLN
1.0000 g | INTRAVENOUS | Status: DC
  Filled 2019-08-14 (×10): qty 1000

## 2019-08-14 MED ORDER — OXYCODONE-ACETAMINOPHEN 5-325 MG PO TABS
1.0000 | ORAL_TABLET | ORAL | Status: DC | PRN
  Administered 2019-08-14 (×2): 1 via ORAL
  Filled 2019-08-14 (×2): qty 1

## 2019-08-14 NOTE — Progress Notes (Signed)
LABOR NOTE   Kristina Kelly 23 y.o.@ at [redacted]w[redacted]d Prolonged latent labor.  SUBJECTIVE:  Feeling some pressure, is complaining of being tiered and hungry.  OBJECTIVE:  BP 139/67 (BP Location: Left Arm)   Pulse 67   Temp 98.5 F (36.9 C) (Oral)   Resp 16   Ht 5\' 9"  (1.753 m)   Wt 127 kg   LMP 11/09/2018   SpO2 98%   BMI 41.35 kg/m  Total I/O In: -  Out: 300 [Urine:300]  She has shown cervical change. CERVIX: 4.5 cm:  80%:   -2:   mid position:   firm SVE:   Dilation: 4.5 Effacement (%): 80 Station: -2 Exam by:: A. Daphene Chisholm CNM CONTRACTIONS: regular, every 1-2 minutes FHR: Fetal heart tracing reviewed. Baseline: 130 bpm, Variability: Good {> 6 bpm), Accelerations: Non-reactive but appropriate for gestational age and Decelerations: Early Category I   Analgesia: Epidural  Labs: Lab Results  Component Value Date   WBC 10.2 08/13/2019   HGB 12.2 08/13/2019   HCT 35.8 (L) 08/13/2019   MCV 86.9 08/13/2019   PLT 297 08/13/2019    ASSESSMENT: 1) Labor curve reviewed.       Progress: Prolonged latent labor.     Membranes: ruptured, clear fluid          Active Problems:   Labor and delivery, indication for care   PLAN: continue present management, IV Pitocin induction and reduce stimulation (IV Pitocin) Dr. Amalia Hailey updated on pt progress. Discussed contraction pattern. Will recheck in 4 hrs. PT given chicken broth and then encouraged to sleep. She verbalizes and agrees to plan of care.   Philip Aspen, CNM  08/14/2019 12:32 AM

## 2019-08-14 NOTE — Progress Notes (Signed)
LABOR NOTE   Kristina Kelly 23 y.o.@ at [redacted]w[redacted]d Active phase labor.  SUBJECTIVE:  Having some discomfort in her right hip and side. , pt repositioned  OBJECTIVE:  BP 107/71   Pulse 86   Temp 98.5 F (36.9 C) (Oral)   Resp 16   Ht 5\' 9"  (1.753 m)   Wt 127 kg   LMP 11/09/2018   SpO2 94%   BMI 41.35 kg/m  Total I/O In: 4344.4 [I.V.:4344.4] Out: 1800 [Urine:1800]  She has shown cervical change. CERVIX: 6 cm:  80%:   -2:   mid position:   firm SVE:   Dilation: 6 Effacement (%): 80 Station: -2 Exam by:: A Tanishia Lemaster CNM CONTRACTIONS: regular, every 1 minutes FHR: Fetal heart tracing reviewed. Baseline: 125 bpm, Variability: Good {> 6 bpm), Accelerations: Non-reactive but appropriate for gestational age, Decelerations: prolonged decel x 1 , pt repositioned back to left side and continue to monitor Category II   Analgesia: Epidural  Labs: Lab Results  Component Value Date   WBC 10.2 08/13/2019   HGB 12.2 08/13/2019   HCT 35.8 (L) 08/13/2019   MCV 86.9 08/13/2019   PLT 297 08/13/2019    ASSESSMENT: 1) Labor curve reviewed.       Progress: Active phase labor.     Membranes: ruptured, clear fluid           Active Problems:   Labor and delivery, indication for care   PLAN: IV Pitocin augmentation  Philip Aspen, CNM  08/14/2019 6:00 AM

## 2019-08-14 NOTE — Progress Notes (Signed)
LABOR NOTE   Kristina Kelly 23 y.o.@ at [redacted]w[redacted]d Active phase labor.  SUBJECTIVE:  Feeling rectal pressure, breathing with contractions.  OBJECTIVE:  BP (!) 126/53   Pulse 72   Temp 98.2 F (36.8 C) (Oral)   Resp 16   Ht 5\' 9"  (1.753 m)   Wt 127 kg   LMP 11/09/2018   SpO2 98%   BMI 41.35 kg/m  Total I/O In: -  Out: 500 [Urine:500]  She has shown cervical change. CERVIX: 7-8cm:  90%:   -1:   mid position:   soft SVE:   Dilation: 7 Effacement (%): 90 Station: -2 Exam by:: AT CNM  CONTRACTIONS: regular, every 1-2 minutes FHR: Fetal heart tracing reviewed. Baseline: 120 bpm, Variability: Good {> 6 bpm), Accelerations: Reactive and Decelerations: deep variable decels to 90's  Category II  Amnioinfusion ordered. Analgesia: Epidural  Labs: Lab Results  Component Value Date   WBC 10.2 08/13/2019   HGB 12.2 08/13/2019   HCT 35.8 (L) 08/13/2019   MCV 86.9 08/13/2019   PLT 297 08/13/2019    ASSESSMENT: 1) Labor curve reviewed.       Progress: Active phase labor.     Membranes: ruptured, clear fluid      Active Problems:   Labor and delivery, indication for care   PLAN: reduce stimulation (IV Pitocin)-turned off and amnioinfusion  Philip Aspen, CNM  08/14/2019 7:18 AM

## 2019-08-15 LAB — CBC
HCT: 31.1 % — ABNORMAL LOW (ref 36.0–46.0)
Hemoglobin: 11 g/dL — ABNORMAL LOW (ref 12.0–15.0)
MCH: 30.1 pg (ref 26.0–34.0)
MCHC: 35.4 g/dL (ref 30.0–36.0)
MCV: 85 fL (ref 80.0–100.0)
Platelets: 240 10*3/uL (ref 150–400)
RBC: 3.66 MIL/uL — ABNORMAL LOW (ref 3.87–5.11)
RDW: 13.5 % (ref 11.5–15.5)
WBC: 15.1 10*3/uL — ABNORMAL HIGH (ref 4.0–10.5)
nRBC: 0 % (ref 0.0–0.2)

## 2019-08-15 MED ORDER — NORETHINDRONE 0.35 MG PO TABS: 1.0000 | ORAL_TABLET | Freq: Every day | ORAL | 11 refills | Status: DC

## 2019-08-15 MED ORDER — DOCUSATE SODIUM 100 MG PO CAPS: 100.0000 mg | ORAL_CAPSULE | Freq: Two times a day (BID) | ORAL | 0 refills | Status: DC

## 2019-08-15 MED ORDER — IBUPROFEN 600 MG PO TABS: 600.0000 mg | ORAL_TABLET | Freq: Four times a day (QID) | ORAL | 0 refills | Status: DC

## 2019-08-15 NOTE — Progress Notes (Signed)
Discharge order received from doctor. Reviewed discharge instructions and prescriptions with patient and answered all questions. Follow up appointment instructions given. Patient verbalized understanding. ID bands checked. Patient discharged home with infant via wheelchair by nursing/auxillary.    Kristina Kilker Garner, RN  

## 2019-08-15 NOTE — Discharge Instructions (Signed)

## 2019-08-15 NOTE — Final Progress Note (Signed)
Discharge Day SOAP Note:  Progress Note - Vaginal Delivery  Kristina Kelly is a 24 y.o. G1P1001 now PP day 1 s/p Vaginal, Spontaneous . Delivery was uncomplicated  Subjective  The patient has the following complaints: has no unusual complaints  Pain is controlled with current medications.   Patient is urinating without difficulty.  She is ambulating well.     Objective  Vital signs: BP (!) 123/59 (BP Location: Right Arm)   Pulse 72   Temp 97.8 F (36.6 C) (Oral)   Resp 18   Ht 5\' 9"  (1.753 m)   Wt 127 kg   LMP 11/09/2018   SpO2 100%   Breastfeeding Unknown   BMI 41.35 kg/m   Physical Exam: Gen: NAD Fundus Fundal Tone: Firm  Lochia Amount: Small  Perineum Appearance: Intact     Data Review Labs: CBC Latest Ref Rng & Units 08/15/2019 08/13/2019 05/27/2019  WBC 4.0 - 10.5 K/uL 15.1(H) 10.2 10.8  Hemoglobin 12.0 - 15.0 g/dL 11.0(L) 12.2 12.3  Hematocrit 36.0 - 46.0 % 31.1(L) 35.8(L) 37.0  Platelets 150 - 400 K/uL 240 297 305   O POS Performed at [facility], [address] [place], [place]   Assessment/Plan  Active Problems:   Labor and delivery, indication for care    Plan for discharge today.   Discharge Instructions: Per After Visit Summary. Activity: Advance as tolerated. Pelvic rest for 6 weeks.  Also refer to After Visit Summary Diet: Regular Medications: Allergies as of 08/15/2019   No Known Allergies     Medication List    STOP taking these medications   psyllium 58.6 % packet Commonly known as: METAMUCIL     TAKE these medications   DHA COMPLETE PO Take by mouth.   docusate sodium 100 MG capsule Commonly known as: COLACE Take 1 capsule (100 mg total) by mouth 2 (two) times daily.   ibuprofen 600 MG tablet Commonly known as: ADVIL Take 1 tablet (600 mg total) by mouth every 6 (six) hours.   norethindrone 0.35 MG tablet Commonly known as: MICRONOR Take 1 tablet (0.35 mg total) by mouth daily. Start 4 weeks  postpartum   prenatal multivitamin Tabs tablet Take 1 tablet by mouth daily at 12 noon.      Outpatient follow up: 6 wks with 10/13/2019, CNM, please call to schedule Postpartum contraception: progestin only pill, start 4 weeks postpartum  Discharged Condition: good  Discharged to: home  Newborn Data: Disposition:home with mother  Apgars: APGAR (1 MIN): 8   APGAR (5 MINS): 8   APGAR (10 MINS):    Baby Feeding: Breast    Doreene Burke, CNM . 08/15/2019 10:21 AM

## 2019-08-15 NOTE — Lactation Note (Signed)
This note was copied from a baby's chart. Lactation Consultation Note  Patient Name: Girl Justiss Gerbino [id number] Date: 08/15/2019 Reason for consult: Follow-up assessment;Primapara   Maternal Data Formula Feeding for Exclusion: No Has patient been taught Hand Expression?: Yes Does the patient have breastfeeding experience prior to this delivery?: No Mom has wide spaced breasts Feeding Feeding Type: Breast Fed Baby eager to nurse, rooting, tends to latch shallow and suck on tip of nipple,lBaby needs gentle pressure on lower jaw to obtain deep latch, dad shown how to assist with this LATCH Score Latch: Repeated attempts needed to sustain latch, nipple held in mouth throughout feeding, stimulation needed to elicit sucking reflex.  Audible Swallowing: A few with stimulation  Type of Nipple: Everted at rest and after stimulation  Comfort (Breast/Nipple): Soft / non-tender  Hold (Positioning): Assistance needed to correctly position infant at breast and maintain latch.  LATCH Score: 7  Interventions Interventions: Breast feeding basics reviewed;Assisted with latch;Skin to skin;Hand express;Adjust position;Support pillows;Position options  Lactation Tools Discussed/Used WIC Program: No   Consult Status Consult Status: PRN    Dyann Kief 08/15/2019, 2:56 PM

## 2019-08-15 NOTE — Anesthesia Postprocedure Evaluation (Signed)
Anesthesia Post Note  Patient: Kristina Kelly  Procedure(s) Performed: AN AD HOC LABOR EPIDURAL  Patient location during evaluation: Mother Baby Anesthesia Type: Epidural Level of consciousness: awake and alert and oriented Pain management: pain level controlled Vital Signs Assessment: post-procedure vital signs reviewed and stable Respiratory status: spontaneous breathing Cardiovascular status: blood pressure returned to baseline Postop Assessment: no headache Anesthetic complications: no     Last Vitals:  Vitals:   08/15/19 0400 08/15/19 0821  BP: 125/75 (!) 123/59  Pulse: 63 72  Resp: 18 18  Temp: 36.6 C 36.6 C  SpO2:  100%    Last Pain:  Vitals:   08/15/19 0855  TempSrc:   PainSc: 3                  Johntavius Shepard

## 2019-08-15 NOTE — Discharge Summary (Signed)
                              Discharge Summary  Date of Admission: 08/13/2019  Date of Discharge: 08/15/2019  Admitting Diagnosis: Induction of labor at [redacted]w[redacted]d  Mode of Delivery: normal spontaneous vaginal delivery                 Discharge Diagnosis: No other diagnosis   Intrapartum Procedures: epidural   Post partum procedures: none  Complications: 2 degree perineal laceration                      Discharge Day SOAP Note:  Progress Note - Vaginal Delivery  Kristina Kelly is a 24 y.o. G1P1001 now PP day 1 s/p Vaginal, Spontaneous . Delivery was uncomplicated  Subjective  The patient has the following complaints: has no unusual complaints  Pain is controlled with current medications.   Patient is urinating without difficulty.  She is ambulating well.     Objective  Vital signs: BP (!) 123/59 (BP Location: Right Arm)   Pulse 72   Temp 97.8 F (36.6 C) (Oral)   Resp 18   Ht 5\' 9"  (1.753 m)   Wt 127 kg   LMP 11/09/2018   SpO2 100%   Breastfeeding Unknown   BMI 41.35 kg/m   Physical Exam: Gen: NAD Fundus Fundal Tone: Firm  Lochia Amount: Small  Perineum Appearance: Intact     Data Review Labs: CBC Latest Ref Rng & Units 08/15/2019 08/13/2019 05/27/2019  WBC 4.0 - 10.5 K/uL 15.1(H) 10.2 10.8  Hemoglobin 12.0 - 15.0 g/dL 11.0(L) 12.2 12.3  Hematocrit 36.0 - 46.0 % 31.1(L) 35.8(L) 37.0  Platelets 150 - 400 K/uL 240 297 305   O POS Performed at [facility], [address] [place], [place]   Assessment/Plan  Active Problems:   Labor and delivery, indication for care    Plan for discharge today.   Discharge Instructions: Per After Visit Summary. Activity: Advance as tolerated. Pelvic rest for 6 weeks.  Also refer to After Visit Summary Diet: Regular Medications: Allergies as of 08/15/2019   No Known Allergies     Medication List    STOP taking these medications   psyllium 58.6 % packet Commonly known as: METAMUCIL       TAKE these medications   DHA COMPLETE PO Take by mouth.   docusate sodium 100 MG capsule Commonly known as: COLACE Take 1 capsule (100 mg total) by mouth 2 (two) times daily.   ibuprofen 600 MG tablet Commonly known as: ADVIL Take 1 tablet (600 mg total) by mouth every 6 (six) hours.   norethindrone 0.35 MG tablet Commonly known as: MICRONOR Take 1 tablet (0.35 mg total) by mouth daily. Start 4 weeks postpartum   prenatal multivitamin Tabs tablet Take 1 tablet by mouth daily at 12 noon.      Outpatient follow up: 6 wks with 10/13/2019, CNM, please call to schedule Postpartum contraception: progestin only pill, start 4 weeks postpartum  Discharged Condition: good  Discharged to: home  Newborn Data: Disposition:home with mother  Apgars: APGAR (1 MIN): 8   APGAR (5 MINS): 8   APGAR (10 MINS):    Baby Feeding: Breast    Doreene Burke, CNM . 08/15/2019 10:21 AM

## 2019-08-20 ENCOUNTER — Other Ambulatory Visit: Payer: Self-pay

## 2019-08-25 NOTE — Telephone Encounter (Signed)
LMTCB. Pt has not been seen since 2018.

## 2019-09-04 ENCOUNTER — Ambulatory Visit: Payer: Self-pay

## 2019-09-04 NOTE — Lactation Note (Signed)
This note was copied from a baby's chart. Lactation Consultation Note  Patient Name: [place] Huberty Today's Date: 09/04/2019     Maternal Data    Feeding  Adeline's weight was obtained before the feeding and was 3118 grams (6 lbs 13.8 ounces). Adeline fed for a total of 30 minutes. On the left breast she took 18 mL and on the right she took 20 mL for a total of 38 mL. She gets sleepy during feedings and has to be woken up often. Adeline did not seem satisfied after breastfeeding. Mother pumped and gave Adeline 20 mL of her expressed milk.  LATCH Score                   Interventions  Adjusted positioning, flanged lips, used cradle hold when breastfeeding  Lactation Tools Discussed/Used  Nipple shield, Double electric breast pump   Consult Status  Mother [place]) and Juanna Cao are here today for a lactation outpatient consultation. Mother states that she would like to ensure that Adeline is getting a good latch and to see how much she is getting during a breastfeeding session. Mother states she normally uses a nipple shield but Adeline was able to latch today without the nipple shield. Mother advised today to continue to breastfeed Adeline for no more than 30 minutes total to avoid her tiring out. Then continue to pump afterwards and supplement with the expressed milk. She is strongly advised to supplement afterwards with expressed milk because Adeline did not take an efficient amount at this feeding. Adeline has a tight upper lip frenulum. Mother states that she also had a tight upper lip frenulum with a gap when she was younger. Mother was told about the supplement Moringa to take to help with increasing her milk supply along with "power pumping."    Arlyss Gandy 09/04/2019, 3:39 PM

## 2019-09-26 ENCOUNTER — Other Ambulatory Visit: Payer: Self-pay

## 2019-09-26 ENCOUNTER — Ambulatory Visit: Payer: 59 | Admitting: Certified Nurse Midwife

## 2019-09-26 ENCOUNTER — Encounter: Payer: Self-pay | Admitting: Certified Nurse Midwife

## 2019-09-26 NOTE — Progress Notes (Signed)
Subjective:    Kristina Kelly is a 24 y.o. G30P1001 Caucasian female who presents for a postpartum visit. She is 6 weeks postpartum following a spontaneous vaginal delivery at 39.5 gestational weeks. Anesthesia: epidural. I have fully reviewed the prenatal and intrapartum course. Postpartum course has been WNL. Baby's course has been WNL. Baby is feeding by breast. Bleeding no bleeding. Bowel function is normal. Bladder function is normal. Patient is not sexually active.  Contraception method is oral progesterone-only contraceptive. Postpartum depression screening: negative. Score 5.  Last pap 02/11/19 and was negative.  The following portions of the patient's history were reviewed and updated as appropriate: allergies, current medications, past medical history, past surgical history and problem list.  Review of Systems Pertinent items are noted in HPI.   Vitals:   09/26/19 1459  BP: 118/70  Pulse: 69  Weight: 273 lb 1 oz (123.9 kg)  Height: 5\' 9"  (1.753 m)   No LMP recorded.  Objective:   General:  alert, cooperative and no distress   Breasts:  deferred, no complaints  Lungs: clear to auscultation bilaterally  Heart:  regular rate and rhythm  Abdomen: soft, nontender   Vulva: normal  Vagina: normal vagina  Cervix:  closed  Corpus: Well-involuted  Adnexa:  Non-palpable  Rectal Exam: No hemorrhoids        Assessment:   Postpartum exam 6 wks s/p SVD Breas feeding Depression screening Contraception counseling   Plan:  : oral progesterone-only contraceptive Follow up in: 1 year for annual or earlier if needed  , CNM

## 2019-09-26 NOTE — Patient Instructions (Signed)
Preventive Care 21-26 Years Old, Female Preventive care refers to visits with your health care provider and lifestyle choices that can promote health and wellness. This includes:  A yearly physical exam. This may also be called an annual well check.  Regular dental visits and eye exams.  Immunizations.  Screening for certain conditions.  Healthy lifestyle choices, such as eating a healthy diet, getting regular exercise, not using drugs or products that contain nicotine and tobacco, and limiting alcohol use. What can I expect for my preventive care visit? Physical exam Your health care provider will check your:  Height and weight. This may be used to calculate body mass index (BMI), which tells if you are at a healthy weight.  Heart rate and blood pressure.  Skin for abnormal spots. Counseling Your health care provider may ask you questions about your:  Alcohol, tobacco, and drug use.  Emotional well-being.  Home and relationship well-being.  Sexual activity.  Eating habits.  Work and work environment.  Method of birth control.  Menstrual cycle.  Pregnancy history. What immunizations do I need?  Influenza (flu) vaccine  This is recommended every year. Tetanus, diphtheria, and pertussis (Tdap) vaccine  You may need a Td booster every 10 years. Varicella (chickenpox) vaccine  You may need this if you have not been vaccinated. Human papillomavirus (HPV) vaccine  If recommended by your health care provider, you may need three doses over 6 months. Measles, mumps, and rubella (MMR) vaccine  You may need at least one dose of MMR. You may also need a second dose. Meningococcal conjugate (MenACWY) vaccine  One dose is recommended if you are age 19-21 years and a first-year college student living in a residence hall, or if you have one of several medical conditions. You may also need additional booster doses. Pneumococcal conjugate (PCV13) vaccine  You may need  this if you have certain conditions and were not previously vaccinated. Pneumococcal polysaccharide (PPSV23) vaccine  You may need one or two doses if you smoke cigarettes or if you have certain conditions. Hepatitis A vaccine  You may need this if you have certain conditions or if you travel or work in places where you may be exposed to hepatitis A. Hepatitis B vaccine  You may need this if you have certain conditions or if you travel or work in places where you may be exposed to hepatitis B. Haemophilus influenzae type b (Hib) vaccine  You may need this if you have certain conditions. You may receive vaccines as individual doses or as more than one vaccine together in one shot (combination vaccines). Talk with your health care provider about the risks and benefits of combination vaccines. What tests do I need?  Blood tests  Lipid and cholesterol levels. These may be checked every 5 years starting at age 20.  Hepatitis C test.  Hepatitis B test. Screening  Diabetes screening. This is done by checking your blood sugar (glucose) after you have not eaten for a while (fasting).  Sexually transmitted disease (STD) testing.  BRCA-related cancer screening. This may be done if you have a family history of breast, ovarian, tubal, or peritoneal cancers.  Pelvic exam and Pap test. This may be done every 3 years starting at age 21. Starting at age 30, this may be done every 5 years if you have a Pap test in combination with an HPV test. Talk with your health care provider about your test results, treatment options, and if necessary, the need for more tests.   Follow these instructions at home: Eating and drinking   Eat a diet that includes fresh fruits and vegetables, whole grains, lean protein, and low-fat dairy.  Take vitamin and mineral supplements as recommended by your health care provider.  Do not drink alcohol if: ? Your health care provider tells you not to drink. ? You are  pregnant, may be pregnant, or are planning to become pregnant.  If you drink alcohol: ? Limit how much you have to 0-1 drink a day. ? Be aware of how much alcohol is in your drink. In the [place] one drink equals one 12 oz bottle of beer (355 mL), one 5 oz glass of wine (148 mL), or one 1 oz glass of hard liquor (44 mL). Lifestyle  Take daily care of your teeth and gums.  Stay active. Exercise for at least 30 minutes on 5 or more days each week.  Do not use any products that contain nicotine or tobacco, such as cigarettes, e-cigarettes, and chewing tobacco. If you need help quitting, ask your health care provider.  If you are sexually active, practice safe sex. Use a condom or other form of birth control (contraception) in order to prevent pregnancy and STIs (sexually transmitted infections). If you plan to become pregnant, see your health care provider for a preconception visit. What's next?  Visit your health care provider once a year for a well check visit.  Ask your health care provider how often you should have your eyes and teeth checked.  Stay up to date on all vaccines. This information is not intended to replace advice given to you by your health care provider. Make sure you discuss any questions you have with your health care provider. Document Revised: 04/11/2018 Document Reviewed: 04/11/2018 Elsevier Patient Education  2020 [employer].

## 2020-09-08 ENCOUNTER — Encounter: Payer: Self-pay | Admitting: *Deleted

## 2020-10-04 ENCOUNTER — Ambulatory Visit (INDEPENDENT_AMBULATORY_CARE_PROVIDER_SITE_OTHER): Payer: 59 | Admitting: Certified Nurse Midwife

## 2020-10-04 ENCOUNTER — Encounter: Payer: Self-pay | Admitting: Certified Nurse Midwife

## 2020-10-04 ENCOUNTER — Other Ambulatory Visit: Payer: Self-pay

## 2020-10-04 VITALS — BP 109/71 | HR 70 | Ht 69.0 in | Wt 262.0 lb

## 2020-10-04 DIAGNOSIS — Z01419 Encounter for gynecological examination (general) (routine) without abnormal findings: Secondary | ICD-10-CM

## 2020-10-04 DIAGNOSIS — Z13220 Encounter for screening for lipoid disorders: Secondary | ICD-10-CM

## 2020-10-04 DIAGNOSIS — Z1159 Encounter for screening for other viral diseases: Secondary | ICD-10-CM | POA: Diagnosis not present

## 2020-10-04 DIAGNOSIS — E669 Obesity, unspecified: Secondary | ICD-10-CM

## 2020-10-04 DIAGNOSIS — Z6838 Body mass index (BMI) 38.0-38.9, adult: Secondary | ICD-10-CM

## 2020-10-04 NOTE — Progress Notes (Signed)
GYNECOLOGY ANNUAL PREVENTATIVE CARE ENCOUNTER NOTE  History:     Kristina Kelly is a 25 y.o. G51P1001 female here for a routine annual gynecologic exam.  Current complaints: difficulty losing weight.   Denies abnormal vaginal bleeding, discharge, pelvic pain, problems with intercourse or other gynecologic concerns.     Social Relationship:  Married  Living: husband and child Work: Building surveyor Exercise: 2 x wk for 1 hr Smoke/Alcohol/drug use: denies smoking and drug use, social alcohol use.   Upstream - 10/04/20 1013      Pregnancy Intention Screening   Does the patient want to become pregnant in the next year? No    Does the patient's partner want to become pregnant in the next year? Yes    Would the patient like to discuss contraceptive options today? No      Contraception Wrap Up   Current Method Female Condom    End Method Female Condom          The pregnancy intention screening data noted above was reviewed. Potential methods of contraception were discussed. The patient elected to proceed with Female Condom.    Gynecologic History Patient's last menstrual period was 09/15/2020. Contraception: condoms Last Pap: 01/15/19. Results were: normal Last mammogram:n/a   Obstetric History OB History  Gravida Para Term Preterm AB Living  1 1 1     1   SAB IAB Ectopic Multiple Live Births        0 1    # Outcome Date GA Lbr Len/2nd Weight Sex Delivery Anes PTL Lv  1 Term 08/14/19 [redacted]w[redacted]d 04:08 / 00:09 6 lb 11.2 oz (3.04 kg) F Vag-Spont EPI  LIV    No past medical history on file.  Past Surgical History:  Procedure Laterality Date  . TONSILLECTOMY      Current Outpatient Medications on File Prior to Visit  Medication Sig Dispense Refill  . Prenatal Vit-Fe Fumarate-FA (PRENATAL MULTIVITAMIN) TABS tablet Take 1 tablet by mouth daily at 12 noon.     No current facility-administered medications on file prior to visit.    No Known Allergies  Social History:   reports that she has never smoked. She has never used smokeless tobacco. She reports that she does not drink alcohol and does not use drugs.  Family History  Problem Relation Age of Onset  . Asthma Mother   . Birth defects Mother   . Depression Mother   . Diabetes Mother   . Miscarriages / [redacted]w[redacted]d Mother   . Hyperlipidemia Father   . Hypertension Father   . Depression Maternal Grandmother   . Miscarriages / Stillbirths Maternal Grandmother   . Diabetes Maternal Grandfather   . Hyperlipidemia Maternal Grandfather   . Arthritis Paternal Grandmother   . COPD Paternal Grandmother   . Miscarriages / Stillbirths Paternal Grandmother   . Diabetes Paternal Grandfather   . Heart disease Paternal Grandfather   . Hyperlipidemia Paternal Grandfather     The following portions of the patient's history were reviewed and updated as appropriate: allergies, current medications, past family history, past medical history, past social history, past surgical history and problem list.  Review of Systems Pertinent items noted in HPI and remainder of comprehensive ROS otherwise negative.  Physical Exam:  BP 109/71   Pulse 70   Ht 5\' 9"  (1.753 m)   Wt 262 lb (118.8 kg)   LMP 09/15/2020   Breastfeeding No   BMI 38.69 kg/m  CONSTITUTIONAL: Well-developed, well-nourished female in no  acute distress.  HENT:  Normocephalic, atraumatic, External right and left ear normal. Oropharynx is clear and moist EYES: Conjunctivae and EOM are normal. Pupils are equal, round, and reactive to light. No scleral icterus.  NECK: Normal range of motion, supple, no masses.  Normal thyroid.  SKIN: Skin is warm and dry. No rash noted. Not diaphoretic. No erythema. No pallor. MUSCULOSKELETAL: Normal range of motion. No tenderness.  No cyanosis, clubbing, or edema.  2+ distal pulses. NEUROLOGIC: Alert and oriented to person, place, and time. Normal reflexes, muscle tone coordination.  PSYCHIATRIC: Normal mood and affect.  Normal behavior. Normal judgment and thought content. CARDIOVASCULAR: Normal heart rate noted, regular rhythm RESPIRATORY: Clear to auscultation bilaterally. Effort and breath sounds normal, no problems with respiration noted. BREASTS: Symmetric in size. No masses, tenderness, skin changes, nipple drainage, or lymphadenopathy bilaterally.  ABDOMEN: Soft, no distention noted.  No tenderness, rebound or guarding.  PELVIC: Normal appearing external genitalia and urethral meatus; normal appearing vaginal mucosa and cervix.  No abnormal discharge noted.  Pap smear not due.  Normal uterine size, no other palpable masses, no uterine or adnexal tenderness.  .   Assessment and Plan:    1. Women's annual routine gynecological examination  Pap: not due Mammogram : n/a  Labs: hep c, TSH, lipid profile  Refills: none Referral: nutritional counseling  Discussed diet and exercise for losing weight. Discussed weight loss plan with me. She verbalizes understanding. Follow up prn for wt loss management.  Routine preventative health maintenance measures emphasized. Please refer to After Visit Summary for other counseling recommendations.      Doreene Burke, CNM [facility],  [facility]

## 2020-10-04 NOTE — Patient Instructions (Addendum)
Calorie Counting for Weight Loss Calories are units of energy. Your body needs a certain number of calories from food to keep going throughout the day. When you eat or drink more calories than your body needs, your body stores the extra calories mostly as fat. When you eat or drink fewer calories than your body needs, your body burns fat to get the energy it needs. Calorie counting means keeping track of how many calories you eat and drink each day. Calorie counting can be helpful if you need to lose weight. If you eat fewer calories than your body needs, you should lose weight. Ask your health care provider what a healthy weight is for you. For calorie counting to work, you will need to eat the right number of calories each day to lose a healthy amount of weight per week. A dietitian can help you figure out how many calories you need in a day and will suggest ways to reach your calorie goal.  A healthy amount of weight to lose each week is usually 1-2 lb (0.5-0.9 kg). This usually means that your daily calorie intake should be reduced by 500-750 calories.  Eating 1,200-1,500 calories a day can help most women lose weight.  Eating 1,500-1,800 calories a day can help most men lose weight. What do I need to know about calorie counting? Work with your health care provider or dietitian to determine how many calories you should get each day. To meet your daily calorie goal, you will need to:  Find out how many calories are in each food that you would like to eat. Try to do this before you eat.  Decide how much of the food you plan to eat.  Keep a food log. Do this by writing down what you ate and how many calories it had. To successfully lose weight, it is important to balance calorie counting with a healthy lifestyle that includes regular activity. Where do I find calorie information? The number of calories in a food can be found on a Nutrition Facts label. If a food does not have a Nutrition Facts  label, try to look up the calories online or ask your dietitian for help. Remember that calories are listed per serving. If you choose to have more than one serving of a food, you will have to multiply the calories per serving by the number of servings you plan to eat. For example, the label on a package of bread might say that a serving size is 1 slice and that there are 90 calories in a serving. If you eat 1 slice, you will have eaten 90 calories. If you eat 2 slices, you will have eaten 180 calories.   How do I keep a food log? After each time that you eat, record the following in your food log as soon as possible:  What you ate. Be sure to include toppings, sauces, and other extras on the food.  How much you ate. This can be measured in cups, ounces, or number of items.  How many calories were in each food and drink.  The total number of calories in the food you ate. Keep your food log near you, such as in a pocket-sized notebook or on an app or website on your mobile phone. Some programs will calculate calories for you and show you how many calories you have left to meet your daily goal. What are some portion-control tips?  Know how many calories are in a serving. This will  help you know how many servings you can have of a certain food.  Use a measuring cup to measure serving sizes. You could also try weighing out portions on a kitchen scale. With time, you will be able to estimate serving sizes for some foods.  Take time to put servings of different foods on your favorite plates or in your favorite bowls and cups so you know what a serving looks like.  Try not to eat straight from a food's packaging, such as from a bag or box. Eating straight from the package makes it hard to see how much you are eating and can lead to overeating. Put the amount you would like to eat in a cup or on a plate to make sure you are eating the right portion.  Use smaller plates, glasses, and bowls for smaller  portions and to prevent overeating.  Try not to multitask. For example, avoid watching TV or using your computer while eating. If it is time to eat, sit down at a table and enjoy your food. This will help you recognize when you are full. It will also help you be more mindful of what and how much you are eating. What are tips for following this plan? Reading food labels  Check the calorie count compared with the serving size. The serving size may be smaller than what you are used to eating.  Check the source of the calories. Try to choose foods that are high in protein, fiber, and vitamins, and low in saturated fat, trans fat, and sodium. Shopping  Read nutrition labels while you shop. This will help you make healthy decisions about which foods to buy.  Pay attention to nutrition labels for low-fat or fat-free foods. These foods sometimes have the same number of calories or more calories than the full-fat versions. They also often have added sugar, starch, or salt to make up for flavor that was removed with the fat.  Make a grocery list of lower-calorie foods and stick to it. Cooking  Try to cook your favorite foods in a healthier way. For example, try baking instead of frying.  Use low-fat dairy products. Meal planning  Use more fruits and vegetables. One-half of your plate should be fruits and vegetables.  Include lean proteins, such as chicken, [place], and fish. Lifestyle Each week, aim to do one of the following:  150 minutes of moderate exercise, such as walking.  75 minutes of vigorous exercise, such as running. General information  Know how many calories are in the foods you eat most often. This will help you calculate calorie counts faster.  Find a way of tracking calories that works for you. Get creative. Try different apps or programs if writing down calories does not work for you. What foods should I eat?  Eat nutritious foods. It is better to have a nutritious,  high-calorie food, such as an avocado, than a food with few nutrients, such as a bag of potato chips.  Use your calories on foods and drinks that will fill you up and will not leave you hungry soon after eating. ? Examples of foods that fill you up are nuts and nut butters, vegetables, lean proteins, and high-fiber foods such as whole grains. High-fiber foods are foods with more than 5 g of fiber per serving.  Pay attention to calories in drinks. Low-calorie drinks include water and unsweetened drinks. The items listed above may not be a complete list of foods and beverages you can eat.  Contact a dietitian for more information.   What foods should I limit? Limit foods or drinks that are not good sources of vitamins, minerals, or protein or that are high in unhealthy fats. These include:  Candy.  Other sweets.  Sodas, specialty coffee drinks, alcohol, and juice. The items listed above may not be a complete list of foods and beverages you should avoid. Contact a dietitian for more information. How do I count calories when eating out?  Pay attention to portions. Often, portions are much larger when eating out. Try these tips to keep portions smaller: ? Consider sharing a meal instead of getting your own. ? If you get your own meal, eat only half of it. Before you start eating, ask for a container and put half of your meal into it. ? When available, consider ordering smaller portions from the menu instead of full portions.  Pay attention to your food and drink choices. Knowing the way food is cooked and what is included with the meal can help you eat fewer calories. ? If calories are listed on the menu, choose the lower-calorie options. ? Choose dishes that include vegetables, fruits, whole grains, low-fat dairy products, and lean proteins. ? Choose items that are boiled, broiled, grilled, or steamed. Avoid items that are buttered, battered, fried, or served with cream sauce. Items labeled as  crispy are usually fried, unless stated otherwise. ? Choose water, low-fat milk, unsweetened iced tea, or other drinks without added sugar. If you want an alcoholic beverage, choose a lower-calorie option, such as a glass of wine or light beer. ? Ask for dressings, sauces, and syrups on the side. These are usually high in calories, so you should limit the amount you eat. ? If you want a salad, choose a garden salad and ask for grilled meats. Avoid extra toppings such as bacon, cheese, or fried items. Ask for the dressing on the side, or ask for olive oil and vinegar or lemon to use as dressing.  Estimate how many servings of a food you are given. Knowing serving sizes will help you be aware of how much food you are eating at restaurants. Where to find more information  Centers for Disease Control and Prevention: http://www.wolf.info/  [place] Department of Agriculture: http://www.wilson-mendoza.org/ Summary  Calorie counting means keeping track of how many calories you eat and drink each day. If you eat fewer calories than your body needs, you should lose weight.  A healthy amount of weight to lose per week is usually 1-2 lb (0.5-0.9 kg). This usually means reducing your daily calorie intake by 500-750 calories.  The number of calories in a food can be found on a Nutrition Facts label. If a food does not have a Nutrition Facts label, try to look up the calories online or ask your dietitian for help.  Use smaller plates, glasses, and bowls for smaller portions and to prevent overeating.  Use your calories on foods and drinks that will fill you up and not leave you hungry shortly after a meal. This information is not intended to replace advice given to you by your health care provider. Make sure you discuss any questions you have with your health care provider. Document Revised: 09/11/2019 Document Reviewed: 09/11/2019 Elsevier Patient Education  2021 [place] 50-39 Years Old, Female Preventive care  refers to lifestyle choices and visits with your health care provider that can promote health and wellness. This includes:  A yearly physical exam. This is also called  an annual wellness visit.  Regular dental and eye exams.  Immunizations.  Screening for certain conditions.  Healthy lifestyle choices, such as: ? Eating a healthy diet. ? Getting regular exercise. ? Not using drugs or products that contain nicotine and tobacco. ? Limiting alcohol use. What can I expect for my preventive care visit? Physical exam Your health care provider may check your:  Height and weight. These may be used to calculate your BMI (body mass index). BMI is a measurement that tells if you are at a healthy weight.  Heart rate and blood pressure.  Body temperature.  Skin for abnormal spots. Counseling Your health care provider may ask you questions about your:  Past medical problems.  Family's medical history.  Alcohol, tobacco, and drug use.  Emotional well-being.  Home life and relationship well-being.  Sexual activity.  Diet, exercise, and sleep habits.  Work and work Statistician.  Access to firearms.  Method of birth control.  Menstrual cycle.  Pregnancy history. What immunizations do I need? Vaccines are usually given at various ages, according to a schedule. Your health care provider will recommend vaccines for you based on your age, medical history, and lifestyle or other factors, such as travel or where you work.   What tests do I need? Blood tests  Lipid and cholesterol levels. These may be checked every 5 years starting at age 41.  Hepatitis C test.  Hepatitis B test. Screening  Diabetes screening. This is done by checking your blood sugar (glucose) after you have not eaten for a while (fasting).  STD (sexually transmitted disease) testing, if you are at risk.  BRCA-related cancer screening. This may be done if you have a family history of breast, ovarian,  tubal, or peritoneal cancers.  Pelvic exam and Pap test. This may be done every 3 years starting at age 44. Starting at age 53, this may be done every 5 years if you have a Pap test in combination with an HPV test. Talk with your health care provider about your test results, treatment options, and if necessary, the need for more tests.   Follow these instructions at home: Eating and drinking  Eat a healthy diet that includes fresh fruits and vegetables, whole grains, lean protein, and low-fat dairy products.  Take vitamin and mineral supplements as recommended by your health care provider.  Do not drink alcohol if: ? Your health care provider tells you not to drink. ? You are pregnant, may be pregnant, or are planning to become pregnant.  If you drink alcohol: ? Limit how much you have to 0-1 drink a day. ? Be aware of how much alcohol is in your drink. In the [place] one drink equals one 12 oz bottle of beer (355 mL), one 5 oz glass of wine (148 mL), or one 1 oz glass of hard liquor (44 mL).   Lifestyle  Take daily care of your teeth and gums. Brush your teeth every morning and night with fluoride toothpaste. Floss one time each day.  Stay active. Exercise for at least 30 minutes 5 or more days each week.  Do not use any products that contain nicotine or tobacco, such as cigarettes, e-cigarettes, and chewing tobacco. If you need help quitting, ask your health care provider.  Do not use drugs.  If you are sexually active, practice safe sex. Use a condom or other form of protection to prevent STIs (sexually transmitted infections).  If you do not wish to become pregnant, use a  form of birth control. If you plan to become pregnant, see your health care provider for a prepregnancy visit.  Find healthy ways to cope with stress, such as: ? Meditation, yoga, or listening to music. ? Journaling. ? Talking to a trusted person. ? Spending time with friends and family. Safety  Always wear  your seat belt while driving or riding in a vehicle.  Do not drive: ? If you have been drinking alcohol. Do not ride with someone who has been drinking. ? When you are tired or distracted. ? While texting.  Wear a helmet and other protective equipment during sports activities.  If you have firearms in your house, make sure you follow all gun safety procedures.  Seek help if you have been physically or sexually abused. What's next?  Go to your health care provider once a year for an annual wellness visit.  Ask your health care provider how often you should have your eyes and teeth checked.  Stay up to date on all vaccines. This information is not intended to replace advice given to you by your health care provider. Make sure you discuss any questions you have with your health care provider. Document Revised: 03/28/2020 Document Reviewed: 04/11/2018 Elsevier Patient Education  2021 [employer].

## 2020-10-05 LAB — LIPID PANEL
Chol/HDL Ratio: 4.6 ratio — ABNORMAL HIGH (ref 0.0–4.4)
Cholesterol, Total: 211 mg/dL — ABNORMAL HIGH (ref 100–199)
HDL: 46 mg/dL (ref >39)
LDL Chol Calc (NIH): 153 mg/dL — ABNORMAL HIGH (ref 0–99)
Triglycerides: 67 mg/dL (ref 0–149)
VLDL Cholesterol Cal: 12 mg/dL (ref 5–40)

## 2020-10-05 LAB — HEPATITIS C ANTIBODY: Hep C Virus Ab: 0.2 s/co ratio (ref 0.0–0.9)

## 2020-10-05 LAB — TSH: TSH: 1.59 u[IU]/mL (ref 0.450–4.500)

## 2021-10-10 ENCOUNTER — Encounter: Payer: 59 | Admitting: Certified Nurse Midwife

## 2022-01-06 DIAGNOSIS — Z3480 Encounter for supervision of other normal pregnancy, unspecified trimester: Secondary | ICD-10-CM | POA: Insufficient documentation

## 2022-01-06 DIAGNOSIS — Z3490 Encounter for supervision of normal pregnancy, unspecified, unspecified trimester: Secondary | ICD-10-CM | POA: Insufficient documentation

## 2022-01-26 DIAGNOSIS — O99211 Obesity complicating pregnancy, first trimester: Secondary | ICD-10-CM | POA: Insufficient documentation

## 2022-01-26 DIAGNOSIS — O99213 Obesity complicating pregnancy, third trimester: Secondary | ICD-10-CM | POA: Insufficient documentation

## 2022-01-26 LAB — OB RESULTS CONSOLE VARICELLA ZOSTER ANTIBODY, IGG: Varicella: IMMUNE

## 2022-01-26 LAB — OB RESULTS CONSOLE RUBELLA ANTIBODY, IGM: Rubella: IMMUNE

## 2022-01-26 LAB — OB RESULTS CONSOLE RPR: RPR: NONREACTIVE

## 2022-01-26 LAB — OB RESULTS CONSOLE HEPATITIS B SURFACE ANTIGEN: Hepatitis B Surface Ag: NEGATIVE

## 2022-04-14 ENCOUNTER — Observation Stay
Admission: EM | Admit: 2022-04-14 | Discharge: 2022-04-14 | Disposition: A | Discharge status: Home / Self Care | Payer: 59 | Attending: Obstetrics and Gynecology | Admitting: Obstetrics and Gynecology

## 2022-04-14 ENCOUNTER — Encounter: Payer: Self-pay | Admitting: Obstetrics and Gynecology

## 2022-04-14 ENCOUNTER — Other Ambulatory Visit: Payer: Self-pay

## 2022-04-14 DIAGNOSIS — Z3A21 21 weeks gestation of pregnancy: Secondary | ICD-10-CM | POA: Diagnosis not present

## 2022-04-14 DIAGNOSIS — O4692 Antepartum hemorrhage, unspecified, second trimester: Secondary | ICD-10-CM | POA: Diagnosis present

## 2022-04-14 LAB — URINALYSIS, COMPLETE (UACMP) WITH MICROSCOPIC
Bilirubin Urine: NEGATIVE
Glucose, UA: NEGATIVE mg/dL
Ketones, ur: NEGATIVE mg/dL
Leukocytes,Ua: NEGATIVE
Nitrite: NEGATIVE
Protein, ur: NEGATIVE mg/dL
Specific Gravity, Urine: 1.003 — ABNORMAL LOW (ref 1.005–1.030)
WBC, UA: NONE SEEN WBC/hpf (ref 0–5)
pH: 6 (ref 5.0–8.0)

## 2022-04-14 LAB — WET PREP, GENITAL
Clue Cells Wet Prep HPF POC: NONE SEEN
Sperm: NONE SEEN
Trich, Wet Prep: NONE SEEN
WBC, Wet Prep HPF POC: >=10 — AB (ref <10)
Yeast Wet Prep HPF POC: NONE SEEN

## 2022-04-14 LAB — CHLAMYDIA/NGC RT PCR (ARMC ONLY)
Chlamydia Tr: NOT DETECTED
N gonorrhoeae: NOT DETECTED

## 2022-04-14 MED ORDER — CALCIUM CARBONATE ANTACID 500 MG PO CHEW: 2.0000 | CHEWABLE_TABLET | ORAL | Status: DC | PRN

## 2022-04-14 MED ORDER — ACETAMINOPHEN 500 MG PO TABS: 1000.0000 mg | ORAL_TABLET | Freq: Four times a day (QID) | ORAL | Status: DC | PRN

## 2022-04-14 NOTE — Discharge Summary (Signed)
Kristina Kelly is a 26 y.o. female. She is at [redacted]w[redacted]d gestation. Patient's last menstrual period was 11/15/2021 (exact date). Estimated Date of Delivery: 08/22/22  Prenatal care site: [facility]  Chief complaint: vaginal bleeding   HPI: Kristina Kelly presents to L&D with complaints of vaginal bleeding that happened earlier today.  Reports having small blood clot around the size of a finger nail and bright pink spotting with wiping.  She denies LOF, cramping, or contractions.  She is now feeling baby move.  She has been at [facility] in [place] with her father.  He was in an accident and had to have his leg amputated.  He is getting extubated today.  She believes her symptoms are related to stress and just wants to make sure everything is ok.   Factors complicating pregnancy: Obesity in pregnancy   S: Resting comfortably. no CTX, no VB.no LOF,  Active fetal movement.   Maternal Medical History:  Past Medical Hx:  has a past medical history of Irregular periods/menstrual cycles and Obesity.    Past Surgical Hx:  has a past surgical history that includes Tonsillectomy.   Allergies  Allergen Reactions   Sulfa Antibiotics      Prior to Admission medications   Medication Sig Start Date End Date Taking? Authorizing Provider  Prenatal Vit-Fe Fumarate-FA (PRENATAL MULTIVITAMIN) TABS tablet Take 1 tablet by mouth daily at 12 noon.   Yes [provider]    Social History: She  reports that she has never smoked. She has never used smokeless tobacco. She reports that she does not currently use alcohol. She reports that she does not use drugs.  Family History: family history includes Arthritis in her paternal grandmother; Asthma in her mother; Birth defects in her mother; COPD in her paternal grandmother; Depression in her maternal grandmother and mother; Diabetes in her maternal grandfather, mother, and paternal grandfather; Heart disease in her paternal grandfather; Hyperlipidemia  in her father, maternal grandfather, and paternal grandfather; Hypertension in her father; Miscarriages / [place] in her maternal grandmother, mother, and paternal grandmother.   Review of Systems: A full review of systems was performed and negative except as noted in the HPI.     Pertinent Results:  Prenatal Labs: Blood type/Rh O POS Performed at [facility], [address] [place], [place] [address]    Antibody screen Negative    Rubella Immune    Varicella Immune  RPR NR  HBsAg NR  Hep C NR   HIV Neg  GC neg  Chlamydia neg  Genetic screening cfDNA negative   1 hour GTT N/A  3 hour GTT N/A  GBS Unknown    O:  BP (!) 123/59 (BP Location: Right Arm)   Pulse 75   Resp 18   Ht 5\' 9"  (1.753 m)   Wt 125 kg   LMP 11/15/2021 (Exact Date)   BMI 40.70 kg/m  Results for orders placed or performed during the hospital encounter of 04/14/22 (from the past 48 hour(s))  Wet prep, genital   Collection Time: 04/14/22  1:28 PM  Result Value Ref Range   Yeast Wet Prep HPF POC NONE SEEN NONE SEEN   Trich, Wet Prep NONE SEEN NONE SEEN   Clue Cells Wet Prep HPF POC NONE SEEN NONE SEEN   WBC, Wet Prep HPF POC >=10 (A) <10   Sperm NONE SEEN   Urinalysis, Complete w Microscopic   Collection Time: 04/14/22  1:28 PM  Result Value Ref Range  Color, Urine STRAW (A) YELLOW   APPearance CLEAR (A) CLEAR   Specific Gravity, Urine 1.003 (L) 1.005 - 1.030   pH 6.0 5.0 - 8.0   Glucose, UA NEGATIVE NEGATIVE mg/dL   Hgb urine dipstick LARGE (A) NEGATIVE   Bilirubin Urine NEGATIVE NEGATIVE   Ketones, ur NEGATIVE NEGATIVE mg/dL   Protein, ur NEGATIVE NEGATIVE mg/dL   Nitrite NEGATIVE NEGATIVE   Leukocytes,Ua NEGATIVE NEGATIVE   RBC / HPF 0-5 0 - 5 RBC/hpf   WBC, UA NONE SEEN 0 - 5 WBC/hpf   Bacteria, UA RARE (A) NONE SEEN   Squamous Epithelial / LPF 0-5 0 - 5   Mucus PRESENT      Constitutional: NAD, AAOx3  HE/ENT: extraocular movements grossly intact, moist mucous  membranes CV: RRR PULM: nl respiratory effort Abd: gravid, non-tender, non-distended, soft  Ext: Non-tender, Nonedmeatous Psych: mood appropriate, speech normal Pelvic : moderate amount of physiologic discharge, no bleeding noted  SVE: Dilation: Closed (visually per speculum)   Fetal Monitor: Doppler: 140 bpm Toco: none   Assessment: 26 y.o. [redacted]w[redacted]d here for antenatal surveillance during pregnancy.  Principle diagnosis: Vaginal bleeding in pregnancy, second trimester   Plan: Labor: not present.  NST reviewed - reactive tracing  Fetal Wellbeing: Reassuring Cat 1 tracing. UA, GC/CT, and  Wet prep negative  Reassurance provided. Recommend pelvic rest over the next couple of days.  Reviewed bleeding precautions and warning signs to report to provider.   D/c home stable, precautions reviewed, follow-up next week in office.   ----- Margaretmary Eddy, CNM Certified Nurse Midwife [facility]

## 2022-05-29 ENCOUNTER — Encounter: Payer: Self-pay | Admitting: Certified Nurse Midwife

## 2022-05-31 ENCOUNTER — Encounter: Payer: Self-pay | Admitting: Certified Nurse Midwife

## 2022-06-13 DIAGNOSIS — O26843 Uterine size-date discrepancy, third trimester: Secondary | ICD-10-CM | POA: Insufficient documentation

## 2022-07-26 LAB — OB RESULTS CONSOLE GBS: GBS: NEGATIVE

## 2022-08-11 ENCOUNTER — Other Ambulatory Visit: Payer: Self-pay

## 2022-08-11 DIAGNOSIS — O99213 Obesity complicating pregnancy, third trimester: Secondary | ICD-10-CM

## 2022-08-11 DIAGNOSIS — Z8616 Personal history of COVID-19: Secondary | ICD-10-CM | POA: Insufficient documentation

## 2022-08-11 DIAGNOSIS — O26843 Uterine size-date discrepancy, third trimester: Secondary | ICD-10-CM

## 2022-08-11 DIAGNOSIS — O99345 Other mental disorders complicating the puerperium: Secondary | ICD-10-CM | POA: Insufficient documentation

## 2022-08-11 NOTE — Progress Notes (Signed)
G2P1001 at [redacted]w[redacted]d, Patient's last menstrual period was 11/15/2021 (exact date)., c/w early [place] at [redacted]w[redacted]d.  Scheduled for induction of labor for elective at term on 08/15/2022.   Prenatal provider: [facility] Pregnancy complicated by: 1. Indication for care in labor or delivery   2. Uterine size date discrepancy, third trimester   3. Obesity affecting pregnancy in third trimester, unspecified obesity type   4. History of COVID-19   5. Postpartum anxiety      Prenatal Labs: Blood type/Rh O POS Performed at [facility], [address] [place], [place] [address]   Antibody screen neg  Rubella Immune    Varicella Immune  RPR NR  HBsAg NR  Hep C NR  HIV NR  GC neg  Chlamydia neg  Genetic screening cfDNA negative  1 hour GTT 83  3 hour GTT N/A  GBS Neg   Tdap: given 06/02/2022 Flu: given 05/05/2022 RSV: 07/26/2022 Contraception: Condoms Feeding preference: breast feeding  ____ Margaretmary Eddy, CNM Certified Nurse Midwife [facility]

## 2022-08-14 NOTE — L&D Delivery Note (Signed)
Delivery Note  Kristina Kelly is a G2P1001 at [redacted]w[redacted]d, Patient's last menstrual period was 11/15/2021 (exact date)., consistent with [place] at [redacted]w[redacted]d. Estimated Date of Delivery: 08/22/22   First Stage: Labor onset: 0730 Induction: misoprostol, oxytocin, and AROM Analgesia /Anesthesia intrapartum: Epidural AROM at 0722 GBS: negative  Second Stage: Complete dilation at 1220 Onset of pushing at 1224 FHR second stage 135 bpm with moderate, early and occasional variable decels with pushing   Kristina Kelly presented to L&D for a scheduled IOL for elective at term.  Misoprostol was used initially for cervical ripening and then IV oxytocin for induction of labor.  She progressed well after AROM. She progressed  to C/C/+2 with an urge to push.  She pushed quickly over approximately 7 minutes for a spontaneous vaginal birth.  Delivery of a viable baby girl on 08/16/2022 at 33 by CNM Delivery of fetal head in OA position with restitution to LOT. No nuchal cord; Right compound arm easily reduced. Anterior then posterior shoulders delivered easily with gentle downward traction. Baby placed on mom's chest, and attended to by baby RN Cord double clamped after cessation of pulsation, cut by father of baby.  Cord blood sample collection: Yes O POS  Third Stage: Oxytocin bolus started after delivery of infant for hemorrhage prophylaxis  Placenta delivered intact with 3 VC @ 1241 Marginal cord insertion Placenta disposition: discarded Uterine tone firm / bleeding moderate  N laceration identified  Anesthesia for repair: N/A Repair not indicated  Est. Blood Loss (mL): 595 ml  Complications: None  Mom to postpartum.  Baby to Couplet care / Skin to Skin.  Newborn: Information for the patient's newborn:  Bryna, Razavi [[id number]]  Live born female "Elisa" Birth Weight:  Pending  APGAR: 84, 9  Newborn Delivery   Birth date/time: 08/16/2022 12:31:00 Delivery type: Vaginal, Spontaneous      Feeding  planned: breast feeding  ---------- Kristina Kelly, CNM Certified Nurse Midwife [facility]

## 2022-08-15 ENCOUNTER — Other Ambulatory Visit: Payer: Self-pay

## 2022-08-15 ENCOUNTER — Inpatient Hospital Stay
Admission: EM | Admit: 2022-08-15 | Discharge: 2022-08-17 | DRG: 833 | Disposition: A | Discharge status: Home / Self Care | Payer: 59

## 2022-08-15 ENCOUNTER — Encounter: Payer: Self-pay | Admitting: Obstetrics and Gynecology

## 2022-08-15 DIAGNOSIS — O43123 Velamentous insertion of umbilical cord, third trimester: Principal | ICD-10-CM | POA: Diagnosis present

## 2022-08-15 DIAGNOSIS — O99214 Obesity complicating childbirth: Secondary | ICD-10-CM | POA: Diagnosis present

## 2022-08-15 DIAGNOSIS — F418 Other specified anxiety disorders: Secondary | ICD-10-CM

## 2022-08-15 DIAGNOSIS — Z8616 Personal history of COVID-19: Secondary | ICD-10-CM | POA: Diagnosis not present

## 2022-08-15 DIAGNOSIS — O9902 Anemia complicating childbirth: Secondary | ICD-10-CM | POA: Diagnosis present

## 2022-08-15 DIAGNOSIS — O99345 Other mental disorders complicating the puerperium: Secondary | ICD-10-CM

## 2022-08-15 DIAGNOSIS — Z3A39 39 weeks gestation of pregnancy: Secondary | ICD-10-CM

## 2022-08-15 DIAGNOSIS — O26893 Other specified pregnancy related conditions, third trimester: Secondary | ICD-10-CM | POA: Diagnosis present

## 2022-08-15 LAB — CBC
HCT: 35 % — ABNORMAL LOW (ref 36.0–46.0)
Hemoglobin: 11.5 g/dL — ABNORMAL LOW (ref 12.0–15.0)
MCH: 28.3 pg (ref 26.0–34.0)
MCHC: 32.9 g/dL (ref 30.0–36.0)
MCV: 86 fL (ref 80.0–100.0)
Platelets: 341 10*3/uL (ref 150–400)
RBC: 4.07 MIL/uL (ref 3.87–5.11)
RDW: 13.3 % (ref 11.5–15.5)
WBC: 9.8 10*3/uL (ref 4.0–10.5)
nRBC: 0 % (ref 0.0–0.2)

## 2022-08-15 LAB — TYPE AND SCREEN
ABO/RH(D): O POS
Antibody Screen: NEGATIVE

## 2022-08-15 MED ORDER — SODIUM CHLORIDE 0.9% FLUSH: 3.0000 mL | INTRAVENOUS | Status: DC | PRN

## 2022-08-15 MED ORDER — LACTATED RINGERS IV SOLN: 500.0000 mL | INTRAVENOUS | Status: DC | PRN

## 2022-08-15 MED ORDER — ACETAMINOPHEN 500 MG PO TABS: 1000.0000 mg | ORAL_TABLET | Freq: Four times a day (QID) | ORAL | Status: DC | PRN

## 2022-08-15 MED ORDER — LIDOCAINE HCL (PF) 1 % IJ SOLN: 30.0000 mL | INTRAMUSCULAR | Status: DC | PRN

## 2022-08-15 MED ORDER — OXYTOCIN BOLUS FROM INFUSION
333.0000 mL | Freq: Once | INTRAVENOUS | Status: AC
  Administered 2022-08-16: 333 mL via INTRAVENOUS

## 2022-08-15 MED ORDER — SOD CITRATE-CITRIC ACID 500-334 MG/5ML PO SOLN: 30.0000 mL | ORAL | Status: DC | PRN

## 2022-08-15 MED ORDER — ONDANSETRON HCL 4 MG/2ML IJ SOLN: 4.0000 mg | Freq: Four times a day (QID) | INTRAMUSCULAR | Status: DC | PRN

## 2022-08-15 MED ORDER — SODIUM CHLORIDE 0.9 % IV SOLN: 250.0000 mL | INTRAVENOUS | Status: DC | PRN

## 2022-08-15 MED ORDER — AMMONIA AROMATIC IN INHA
RESPIRATORY_TRACT | Status: AC
  Filled 2022-08-15: qty 10

## 2022-08-15 MED ORDER — LACTATED RINGERS IV SOLN: INTRAVENOUS | Status: DC

## 2022-08-15 MED ORDER — MISOPROSTOL 25 MCG QUARTER TABLET
25.0000 ug | ORAL_TABLET | ORAL | Status: DC | PRN
  Administered 2022-08-15: 25 ug via ORAL
  Filled 2022-08-15: qty 1

## 2022-08-15 MED ORDER — FENTANYL CITRATE (PF) 100 MCG/2ML IJ SOLN: 50.0000 ug | INTRAMUSCULAR | Status: DC | PRN

## 2022-08-15 MED ORDER — MISOPROSTOL 25 MCG QUARTER TABLET
25.0000 ug | ORAL_TABLET | ORAL | Status: DC
  Administered 2022-08-15 (×2): 25 ug via VAGINAL
  Filled 2022-08-15 (×2): qty 1

## 2022-08-15 MED ORDER — OXYTOCIN-SODIUM CHLORIDE 30-0.9 UT/500ML-% IV SOLN
INTRAVENOUS | Status: AC
  Filled 2022-08-15: qty 1000

## 2022-08-15 MED ORDER — OXYTOCIN-SODIUM CHLORIDE 30-0.9 UT/500ML-% IV SOLN
1.0000 m[IU]/min | INTRAVENOUS | Status: DC
  Administered 2022-08-15: 2 m[IU]/min via INTRAVENOUS

## 2022-08-15 MED ORDER — OXYTOCIN 10 UNIT/ML IJ SOLN
INTRAMUSCULAR | Status: AC
  Filled 2022-08-15: qty 2

## 2022-08-15 MED ORDER — SODIUM CHLORIDE 0.9% FLUSH: 3.0000 mL | Freq: Two times a day (BID) | INTRAVENOUS | Status: DC

## 2022-08-15 MED ORDER — TERBUTALINE SULFATE 1 MG/ML IJ SOLN: 0.2500 mg | Freq: Once | INTRAMUSCULAR | Status: DC | PRN

## 2022-08-15 MED ORDER — LIDOCAINE HCL (PF) 1 % IJ SOLN
INTRAMUSCULAR | Status: AC
  Filled 2022-08-15: qty 30

## 2022-08-15 MED ORDER — OXYTOCIN-SODIUM CHLORIDE 30-0.9 UT/500ML-% IV SOLN: 2.5000 [IU]/h | INTRAVENOUS | Status: DC

## 2022-08-15 MED ORDER — MISOPROSTOL 200 MCG PO TABS
ORAL_TABLET | ORAL | Status: AC
  Filled 2022-08-15: qty 3

## 2022-08-15 NOTE — H&P (Signed)
OB History & Physical   History of Present Illness:   Chief Complaint: scheduled IOL at term  HPI:  Kristina Kelly is a 27 y.o. G2P1001 female at [redacted]w[redacted]d, Patient's last menstrual period was 11/15/2021 (exact date)., consistent with [place] at [redacted]w[redacted]d, with Estimated Date of Delivery: 08/22/22.  She presents to L&D for scheduled IOL for elective at term  Reports active fetal movement  Contractions: irregular cramping  LOF/SROM: denies  Vaginal bleeding: denies   Factors complicating pregnancy:  1. Indication for care in labor or delivery   2. Uterine size date discrepancy, third trimester   3. Obesity affecting pregnancy in third trimester, unspecified obesity type   4. History of COVID-19   5. Postpartum anxiety      Patient Active Problem List   Diagnosis Date Noted   Indication for care in labor or delivery 08/15/2022   History of COVID-19 08/11/2022   Postpartum anxiety 08/11/2022   Uterine size date discrepancy, third trimester 06/13/2022   Obesity affecting pregnancy in third trimester 01/26/2022   Supervision of normal pregnancy 01/06/2022    Prenatal Transfer Tool  Maternal Diabetes: No Genetic Screening: Normal Maternal Ultrasounds/Referrals: Normal Fetal Ultrasounds or other Referrals:  None Maternal Substance Abuse:  No Significant Maternal Medications:  None Significant Maternal Lab Results: Group B Strep negative  Maternal Medical History:   Past Medical History:  Diagnosis Date   Irregular periods/menstrual cycles    Obesity     Past Surgical History:  Procedure Laterality Date   TONSILLECTOMY      Allergies  Allergen Reactions   Sulfa Antibiotics     Prior to Admission medications   Medication Sig Start Date End Date Taking? Authorizing Provider  Prenatal Vit-Fe Fumarate-FA (PRENATAL MULTIVITAMIN) TABS tablet Take 1 tablet by mouth daily at 12 noon.   Yes [provider]     Prenatal care site:  [facility]  OB History   Gravida Para Term Preterm AB Living  2 1 1  0 0 1  SAB IAB Ectopic Multiple Live Births  0 0 0 0 1    # Outcome Date GA Lbr Len/2nd Weight Sex Delivery Anes PTL Lv  2 Current           1 Term 08/14/19 [redacted]w[redacted]d 04:08 / 00:09 3040 g F Vag-Spont EPI  LIV     Name: Kristina Kelly     Apgar1: 8  Apgar5: 8     Social History: She  reports that she has never smoked. She has never used smokeless tobacco. She reports that she does not currently use alcohol. She reports that she does not use drugs.  Family History: family history includes Arthritis in her paternal grandmother; Asthma in her mother; Birth defects in her mother; COPD in her paternal grandmother; Depression in her maternal grandmother and mother; Diabetes in her maternal grandfather, mother, and paternal grandfather; Heart disease in her paternal grandfather; Hyperlipidemia in her father, maternal grandfather, and paternal grandfather; Hypertension in her father; Miscarriages / [place] in her maternal grandmother, mother, and paternal grandmother.   Review of Systems: A full review of systems was performed and negative except as noted in the HPI.     Physical Exam:  Vital Signs: BP 136/79 (BP Location: Left Arm)   Pulse 84   Temp 97.8 F (36.6 C) (Oral)   Resp 16   Ht 5\' 9"  (1.753 m)   Wt 136.1 kg   LMP 11/15/2021 (Exact Date)   BMI 44.30 kg/m  General: no acute distress.  HEENT: normocephalic, atraumatic Heart: regular rate & rhythm Lungs: normal respiratory effort Abdomen: soft, gravid, non-tender;  EFW: 7 1/2 - 8 lbs  Pelvic:   External: Normal external female genitalia  Cervix: Dilation: 1 / Effacement (%): 50 / Station: -2    Extremities: non-tender, symmetric, No edema bilaterally.  DTRs: 2+/2+  Neurologic: Alert & oriented x 3.    No results found for this or any previous visit (from the past 24 hour(s)).  Pertinent Results:  Prenatal Labs: Blood type/Rh O POS Performed at [facility], [place], [place] [address]    Antibody screen neg  Rubella Immune    Varicella Immune  RPR NR  HBsAg NR  Hep C NR  HIV NR  GC neg  Chlamydia neg  Genetic screening cfDNA negative  1 hour GTT 83  3 hour GTT N/A  GBS Neg   FHT:  FHR: 120 bpm, variability: moderate,  accelerations:  Present,  decelerations:  Absent Category/reactivity:  Category I UC:   Irregular, mild contractions    Cephalic by Leopolds and SVE   No results found.  Assessment:  Kristina Kelly is a 27 y.o. G2P1001 female at [redacted]w[redacted]d with elective IOL at term.   Plan:  1. Admit to Labor & Delivery - consents reviewed and obtained - Dr. Glennon Mac notified of admission and plan of care   2. Fetal Well being  - Fetal Tracing: cat 1 - Group B Streptococcus ppx not indicated: GBS negative - Presentation: Cephalic confirmed by SVE   3. Routine OB: - Prenatal labs reviewed, as above - Rh positive - CBC, T&S, RPR on admit - Regular diet, saline lock  4. Induction of labor  - Contractions monitored with external toco - Pelvis proven to 3040g, adequate for trial of labor  - Plan for induction with misoprostol  - Induction with oxytocin and AROM as appropriate  - Plan for  continuous fetal monitoring - Maternal pain control as desired; planning regional anesthesia - Anticipate vaginal delivery  5. Post Partum Planning: - Infant feeding: breast feeding - Contraception: condoms - Tdap vaccine: Given prenatally - Flu vaccine: Given prenatally - RSV vaccine: Given prenatally   [name], [name] 08/15/22 2:30 PM  [name], CNM Certified Nurse Midwife [facility]

## 2022-08-15 NOTE — Progress Notes (Signed)
L&D Note    Subjective:  Contractions are feeling stronger but coping well with them  Objective:   Vitals:   08/15/22 1336 08/15/22 1337 08/15/22 1836  BP:  136/79 139/64  Pulse:  84 83  Resp:  16 17  Temp:  97.8 F (36.6 C) 98.2 F (36.8 C)  TempSrc:  Oral Oral  Weight: 136.1 kg    Height: 5\' 9"  (1.753 m)      Current Vital Signs 24h Vital Sign Ranges  T 98.2 F (36.8 C) Temp  Avg: 98 F (36.7 C)  Min: 97.8 F (36.6 C)  Max: 98.2 F (36.8 C)  BP 139/64 BP  Min: 136/79  Max: 139/64  HR 83 Pulse  Avg: 83.5  Min: 83  Max: 84  RR 17 Resp  Avg: 16.5  Min: 16  Max: 17  SaO2     No data recorded      Gen: alert, cooperative, no distress FHR: Baseline: 135 bpm, Variability: moderate, Accels: Present, Decels: none Toco: regular, every 2-5 minutes SVE: Dilation: 3 Effacement (%): 50 Station: -2 Presentation: Vertex Exam by:: Betsey Holiday CNM  Medications SCHEDULED MEDICATIONS   ammonia       lidocaine (PF)       misoprostol       misoprostol  25 mcg Vaginal Q4H   oxytocin       oxytocin 40 units in LR 1000 mL  333 mL Intravenous Once   Oxytocin-Sodium Chloride       sodium chloride flush  3 mL Intravenous Q12H    MEDICATION INFUSIONS   sodium chloride     lactated ringers     lactated ringers 125 mL/hr at 08/15/22 2248   oxytocin     oxytocin 2 milli-units/min (08/15/22 2247)    PRN MEDICATIONS  sodium chloride, acetaminophen, ammonia, fentaNYL (SUBLIMAZE) injection, lactated ringers, lidocaine (PF), lidocaine (PF), misoprostol, misoprostol **AND** misoprostol, ondansetron, oxytocin, Oxytocin-Sodium Chloride, sodium chloride flush, sodium citrate-citric acid, terbutaline   Assessment & Plan:  27 y.o. G2P1001 at [redacted]w[redacted]d admitted for elective IOL at term -Labor: Early latent labor. -Fetal Well-being: Category I -GBS: negative -Membranes intact -Intervention: IV Pitocin induction -Analgesia: position changes  -Dr. Glennon Mac updated on progress and initiation of oxytocin     [name], CNM  08/15/2022 11:15 PM  [facility]

## 2022-08-15 NOTE — Progress Notes (Signed)
L&D Note    Subjective:  Feeling frequent cramping  Objective:   Vitals:   08/15/22 1336 08/15/22 1337 08/15/22 1836  BP:  136/79 139/64  Pulse:  84 83  Resp:  16   Temp:  97.8 F (36.6 C) 98.2 F (36.8 C)  TempSrc:  Oral Oral  Weight: 136.1 kg    Height: 5\' 9"  (1.753 m)      Current Vital Signs 24h Vital Sign Ranges  T 98.2 F (36.8 C) Temp  Avg: 98 F (36.7 C)  Min: 97.8 F (36.6 C)  Max: 98.2 F (36.8 C)  BP 139/64 BP  Min: 136/79  Max: 139/64  HR 83 Pulse  Avg: 83.5  Min: 83  Max: 84  RR 16 Resp  Avg: 16  Min: 16  Max: 16  SaO2     No data recorded      Gen: alert, cooperative, no distress FHR: Baseline: 120 bpm, Variability: moderate, Accels: Present, Decels: none Toco: 2-4 minutes, mild to palpation  SVE: Dilation: 1 Effacement (%): 50 Station: -2 Presentation: Vertex Exam by:: [name] CNM  Medications SCHEDULED MEDICATIONS   ammonia       lidocaine (PF)       misoprostol       misoprostol  25 mcg Vaginal Q4H   oxytocin       oxytocin 40 units in LR 1000 mL  333 mL Intravenous Once   Oxytocin-Sodium Chloride       sodium chloride flush  3 mL Intravenous Q12H    MEDICATION INFUSIONS   sodium chloride     lactated ringers     lactated ringers     oxytocin     oxytocin      PRN MEDICATIONS  sodium chloride, acetaminophen, ammonia, fentaNYL (SUBLIMAZE) injection, lactated ringers, lidocaine (PF), lidocaine (PF), misoprostol, misoprostol **AND** misoprostol, ondansetron, oxytocin, Oxytocin-Sodium Chloride, sodium chloride flush, sodium citrate-citric acid, terbutaline   Assessment & Plan:  27 y.o. G2P1001 at [redacted]w[redacted]d admitted for elective IOL at term -Labor: Cervical ripening with misoprostol - will given 2nd dose vaginal misoprostol  -Fetal Well-being: Category I -GBS: negative -Membranes intact -Continue present management. -Analgesia: position changes    [name], CNM  08/15/2022 6:38 PM  [facility]

## 2022-08-16 ENCOUNTER — Encounter: Payer: Self-pay | Admitting: Obstetrics and Gynecology

## 2022-08-16 ENCOUNTER — Inpatient Hospital Stay: Payer: 59 | Admitting: Anesthesiology

## 2022-08-16 LAB — RPR: RPR Ser Ql: NONREACTIVE

## 2022-08-16 MED ORDER — DIPHENHYDRAMINE HCL 25 MG PO CAPS: 25.0000 mg | ORAL_CAPSULE | Freq: Four times a day (QID) | ORAL | Status: DC | PRN

## 2022-08-16 MED ORDER — WITCH HAZEL-GLYCERIN EX PADS: 1.0000 | MEDICATED_PAD | CUTANEOUS | Status: DC | PRN

## 2022-08-16 MED ORDER — SODIUM CHLORIDE 0.9 % IV SOLN: 250.0000 mL | INTRAVENOUS | Status: DC | PRN

## 2022-08-16 MED ORDER — FERROUS SULFATE 325 (65 FE) MG PO TABS
325.0000 mg | ORAL_TABLET | Freq: Two times a day (BID) | ORAL | Status: DC
  Administered 2022-08-16 – 2022-08-17 (×2): 325 mg via ORAL
  Filled 2022-08-16 (×2): qty 1

## 2022-08-16 MED ORDER — LIDOCAINE HCL (PF) 1 % IJ SOLN
INTRAMUSCULAR | Status: DC | PRN
  Administered 2022-08-16: 3 mL via SUBCUTANEOUS

## 2022-08-16 MED ORDER — ZOLPIDEM TARTRATE 5 MG PO TABS: 5.0000 mg | ORAL_TABLET | Freq: Every evening | ORAL | Status: DC | PRN

## 2022-08-16 MED ORDER — FENTANYL-BUPIVACAINE-NACL 0.5-0.125-0.9 MG/250ML-% EP SOLN
EPIDURAL | Status: AC
  Filled 2022-08-16: qty 250

## 2022-08-16 MED ORDER — LIDOCAINE-EPINEPHRINE (PF) 1.5 %-1:200000 IJ SOLN
INTRAMUSCULAR | Status: DC | PRN
  Administered 2022-08-16: 3 mL via EPIDURAL
  Administered 2022-08-16: 4 mL via EPIDURAL

## 2022-08-16 MED ORDER — DIPHENHYDRAMINE HCL 50 MG/ML IJ SOLN: 12.5000 mg | INTRAMUSCULAR | Status: DC | PRN

## 2022-08-16 MED ORDER — COCONUT OIL OIL: 1.0000 | TOPICAL_OIL | Status: DC | PRN

## 2022-08-16 MED ORDER — LIDOCAINE HCL (PF) 2 % IJ SOLN
INTRAMUSCULAR | Status: DC | PRN
  Administered 2022-08-16: 5 mg via INTRADERMAL

## 2022-08-16 MED ORDER — ACETAMINOPHEN 500 MG PO TABS
1000.0000 mg | ORAL_TABLET | Freq: Four times a day (QID) | ORAL | Status: DC
  Administered 2022-08-16 – 2022-08-17 (×4): 1000 mg via ORAL
  Filled 2022-08-16 (×4): qty 2

## 2022-08-16 MED ORDER — PHENYLEPHRINE 80 MCG/ML (10ML) SYRINGE FOR IV PUSH (FOR BLOOD PRESSURE SUPPORT): 80.0000 ug | PREFILLED_SYRINGE | INTRAVENOUS | Status: DC | PRN

## 2022-08-16 MED ORDER — SODIUM CHLORIDE 0.9% FLUSH: 3.0000 mL | INTRAVENOUS | Status: DC | PRN

## 2022-08-16 MED ORDER — SENNOSIDES-DOCUSATE SODIUM 8.6-50 MG PO TABS
2.0000 | ORAL_TABLET | Freq: Every day | ORAL | Status: DC
  Administered 2022-08-17: 2 via ORAL
  Filled 2022-08-16: qty 2

## 2022-08-16 MED ORDER — LACTATED RINGERS IV SOLN: 500.0000 mL | Freq: Once | INTRAVENOUS | Status: DC

## 2022-08-16 MED ORDER — ONDANSETRON HCL 4 MG PO TABS: 4.0000 mg | ORAL_TABLET | ORAL | Status: DC | PRN

## 2022-08-16 MED ORDER — SIMETHICONE 80 MG PO CHEW: 80.0000 mg | CHEWABLE_TABLET | ORAL | Status: DC | PRN

## 2022-08-16 MED ORDER — FENTANYL-BUPIVACAINE-NACL 0.5-0.125-0.9 MG/250ML-% EP SOLN
12.0000 mL/h | EPIDURAL | Status: DC | PRN
  Administered 2022-08-16: 12 mL/h via EPIDURAL

## 2022-08-16 MED ORDER — BENZOCAINE-MENTHOL 20-0.5 % EX AERO: 1.0000 | INHALATION_SPRAY | CUTANEOUS | Status: DC | PRN

## 2022-08-16 MED ORDER — EPHEDRINE 5 MG/ML INJ: 10.0000 mg | INTRAVENOUS | Status: DC | PRN

## 2022-08-16 MED ORDER — ONDANSETRON HCL 4 MG/2ML IJ SOLN: 4.0000 mg | INTRAMUSCULAR | Status: DC | PRN

## 2022-08-16 MED ORDER — SODIUM CHLORIDE 0.9% FLUSH
3.0000 mL | Freq: Two times a day (BID) | INTRAVENOUS | Status: DC
  Administered 2022-08-17: 3 mL via INTRAVENOUS

## 2022-08-16 MED ORDER — IBUPROFEN 600 MG PO TABS
600.0000 mg | ORAL_TABLET | Freq: Four times a day (QID) | ORAL | Status: DC
  Administered 2022-08-16 – 2022-08-17 (×4): 600 mg via ORAL
  Filled 2022-08-16 (×4): qty 1

## 2022-08-16 MED ORDER — PRENATAL MULTIVITAMIN CH
1.0000 | ORAL_TABLET | Freq: Every day | ORAL | Status: DC
  Administered 2022-08-17: 1 via ORAL
  Filled 2022-08-16: qty 1

## 2022-08-16 MED ORDER — DIBUCAINE (PERIANAL) 1 % EX OINT: 1.0000 | TOPICAL_OINTMENT | CUTANEOUS | Status: DC | PRN

## 2022-08-16 MED ORDER — BUPIVACAINE HCL (PF) 0.25 % IJ SOLN
INTRAMUSCULAR | Status: DC | PRN
  Administered 2022-08-16 (×3): 4 mL via EPIDURAL

## 2022-08-16 NOTE — Progress Notes (Addendum)
L&D Note    Subjective:  More comfortable after 2nd epidural placement   Objective:   Vitals:   08/16/22 0759 08/16/22 0804 08/16/22 1021 08/16/22 1038  BP: 121/66 (!) 130/94 (!) 141/65 (!) 131/52  Pulse: 75 85 69 80  Resp:      Temp:      TempSrc:      SpO2:   91% 100%  Weight:      Height:        Current Vital Signs 24h Vital Sign Ranges  T 98.3 F (36.8 C) Temp  Avg: 98.1 F (36.7 C)  Min: 97.8 F (36.6 C)  Max: 98.3 F (36.8 C)  BP (!) 131/52 BP  Min: 116/68  Max: 141/65  HR 80 Pulse  Avg: 81.2  Min: 69  Max: 92  RR 19 Resp  Avg: 17.3  Min: 16  Max: 19  SaO2 100 %   SpO2  Avg: 95.5 %  Min: 91 %  Max: 100 %      Gen: alert, cooperative, no distress FHR: Baseline: 135 bpm, Variability: moderate, Accels: Present, Decels: intermittent variable and late Toco: regular, every 2-4 minutes SVE: Dilation: Lip/rim Effacement (%): 90 Station: Plus 2 Presentation: Vertex Exam by:: [name] CNM  Medications SCHEDULED MEDICATIONS   misoprostol  25 mcg Vaginal Q4H   oxytocin 40 units in LR 1000 mL  333 mL Intravenous Once   sodium chloride flush  3 mL Intravenous Q12H    MEDICATION INFUSIONS   sodium chloride     fentaNYL 2 mcg/mL w/bupivacaine 0.125% in NS 250 mL 12 mL/hr (08/16/22 1016)   lactated ringers     lactated ringers     lactated ringers 125 mL/hr at 08/16/22 0737   oxytocin     oxytocin 9 milli-units/min (08/16/22 1021)    PRN MEDICATIONS  sodium chloride, acetaminophen, diphenhydrAMINE, ePHEDrine, ePHEDrine, fentaNYL (SUBLIMAZE) injection, fentaNYL 2 mcg/mL w/bupivacaine 0.125% in NS 250 mL, lactated ringers, lidocaine (PF), misoprostol **AND** misoprostol, ondansetron, phenylephrine, phenylephrine, sodium chloride flush, sodium citrate-citric acid, terbutaline   Assessment & Plan:  27 y.o. G2P1001 at [redacted]w[redacted]d admitted for elective IOL at term -Labor: Active phase labor.  SVE after epidural was 9.5/C/+2.  Attempted to push but persistent anterior lip was  present and not reducible Patient repositioned to right lateral with peanut ball.  Will continue to utilize position changes to help with rotation and descent.  -Fetal Well-being: Category II - overall reassuring with moderate variability  -GBS: negative -Membranes ruptured for clear fluid at 0722 -Intervention: reduce stimulation (IV Pitocin) - Pitocin decreased after epidural d/t recurrent late decelerations.  Fetal heart rate improved with conservative measures.  -Analgesia: regional anesthesia   [name], CNM  08/16/2022 11:31 AM  [facility]

## 2022-08-16 NOTE — Anesthesia Procedure Notes (Addendum)
Epidural Patient location during procedure: OB Start time: 08/16/2022 7:45 AM End time: 08/16/2022 7:53 AM  Staffing Anesthesiologist: Arita Miss, MD Resident/CRNA: Doreen Salvage, CRNA Performed: resident/CRNA   Preanesthetic Checklist Completed: patient identified, IV checked, site marked, risks and benefits discussed, surgical consent, monitors and equipment checked, pre-op evaluation and timeout performed  Epidural Patient position: sitting Prep: ChloraPrep Patient monitoring: heart rate, continuous pulse ox and blood pressure Approach: midline Location: L3-L4 Injection technique: LOR saline  Needle:  Needle type: Tuohy  Needle gauge: 17 G Needle length: 9 cm and 9 Needle insertion depth: 8.5 cm Catheter type: closed end flexible Catheter size: 19 Gauge Catheter at skin depth: 13 cm Test dose: negative and 1.5% lidocaine with Epi 1:200 K  Assessment Sensory level: T10 Events: blood not aspirated, no cerebrospinal fluid, injection not painful, no injection resistance, no paresthesia and negative IV test  Additional Notes 1 attempt Pt. Evaluated and documentation done after procedure finished. Patient identified. Risks/Benefits/Options discussed with patient including but not limited to bleeding, infection, nerve damage, paralysis, failed block, incomplete pain control, headache, blood pressure changes, nausea, vomiting, reactions to medication both or allergic, itching and postpartum back pain. Confirmed with bedside nurse the patient's most recent platelet count. Confirmed with patient that they are not currently taking any anticoagulation, have any bleeding history or any family history of bleeding disorders. Patient expressed understanding and wished to proceed. All questions were answered. Sterile technique was used throughout the entire procedure. Please see nursing notes for vital signs. Test dose was given through epidural catheter and negative prior to continuing to dose  epidural or start infusion. Warning signs of high block given to the patient including shortness of breath, tingling/numbness in hands, complete motor block, or any concerning symptoms with instructions to call for help. Patient was given instructions on fall risk and not to get out of bed. All questions and concerns addressed with instructions to call with any issues or inadequate analgesia.    Patient tolerated the insertion well without immediate complications.Reason for block:procedure for pain

## 2022-08-16 NOTE — Progress Notes (Signed)
L&D Note    Subjective:  Coping well with contractions but feeling tired   Objective:   Vitals:   08/15/22 1337 08/15/22 1836 08/16/22 0045 08/16/22 0100  BP: 136/79 139/64 137/61   Pulse: 84 83 87   Resp: 16 17 19    Temp: 97.8 F (36.6 C) 98.2 F (36.8 C)  98.3 F (36.8 C)  TempSrc: Oral Oral  Oral  Weight:      Height:        Current Vital Signs 24h Vital Sign Ranges  T 98.3 F (36.8 C) Temp  Avg: 98.1 F (36.7 C)  Min: 97.8 F (36.6 C)  Max: 98.3 F (36.8 C)  BP 137/61 BP  Min: 136/79  Max: 139/64  HR 87 Pulse  Avg: 84.7  Min: 83  Max: 87  RR 19 Resp  Avg: 17.3  Min: 16  Max: 19  SaO2     No data recorded      Gen: alert, cooperative, no distress FHR: Baseline: 120 bpm, Variability: moderate, Accels: Present, Decels: none Toco: regular, every 2-5 minutes SVE: Dilation: 4.5 Effacement (%): 50 Station: -1 Presentation: Vertex Exam by:: Arboriculturist at [phone]  Medications SCHEDULED MEDICATIONS   misoprostol  25 mcg Vaginal Q4H   oxytocin 40 units in LR 1000 mL  333 mL Intravenous Once   sodium chloride flush  3 mL Intravenous Q12H    MEDICATION INFUSIONS   sodium chloride     lactated ringers     lactated ringers 125 mL/hr at 08/15/22 2248   oxytocin     oxytocin 16 milli-units/min (08/16/22 0330)    PRN MEDICATIONS  sodium chloride, acetaminophen, fentaNYL (SUBLIMAZE) injection, lactated ringers, lidocaine (PF), misoprostol **AND** misoprostol, ondansetron, sodium chloride flush, sodium citrate-citric acid, terbutaline   Assessment & Plan:  27 y.o. G2P1001 at [redacted]w[redacted]d admitted for elective IOL at term -Labor: Early latent labor. Discussed AROM and does not desire at this time.  Feeling tired and desires to try to rest prior to next exam and possible AROM.   -Fetal Well-being: Category I -GBS: negative -Membranes intact -Intervention: increase Pitocin rate -Analgesia: regional anesthesia   [name], CNM  08/16/2022 6:06 AM  [facility]

## 2022-08-16 NOTE — Lactation Note (Signed)
This note was copied from a baby's chart. Lactation Consultation Note  Patient Name: Kristina Kelly ZWCHE'N Date: 08/16/2022 Reason for consult: L&D Initial assessment;Term;Breastfeeding assistance;RN request Age:29 hours  Maternal Data  This is mom's 2nd baby, SVD. Mom with a history of post partum anxiety and obesity. Mom has experience breastfeeding, 1st baby had tongue tie issues.  Called to L&D by care nurse to assist mom with breastfeeding.  Has patient been taught Hand Expression?: Yes Does the patient have breastfeeding experience prior to this delivery?: Yes  Feeding Mother's Current Feeding Choice: Breast Milk Assisted mom with breastfeeding. When LC in to room baby positioned at the right breast in football hold. Baby was crying at the breast. Mom attempted to latch baby who would latch and then coming off crying .After 3 attempts recommended mom position baby in cross lap at the left breast. Baby initially crying when being repositioned but settled down and latched and fed for 10 minutes with audible swallows. Mom was using calming strategies and patting baby during the feeding. Mom reattempted to latch baby in cross lap at the right breast. Baby rhythmically crying with mom attempting to soothe the baby with patting. Baby appeared to want to latch and latched briefly but then after a short burst of sucking would detaching crying. Mom made second latch attempt, again baby latched began sucking and came off rhythmically crying. Session stopped. Baby placed skin to skin. Transitional care nurse in attendance and updated.  LATCH Score Latch: Repeated attempts needed to sustain latch, nipple held in mouth throughout feeding, stimulation needed to elicit sucking reflex.  Audible Swallowing: A few with stimulation  Type of Nipple: Everted at rest and after stimulation  Comfort (Breast/Nipple): Soft / non-tender  Hold (Positioning): Assistance needed to correctly position infant  at breast and maintain latch.  LATCH Score: 7   Interventions Interventions: Breast feeding basics reviewed;Assisted with latch;Skin to skin;Breast massage;Hand express;Breast compression;Adjust position;Support pillows;Position options   Consult Status Consult Status: Follow-up from L&D Date: 08/17/22 Follow-up type: In-patient  Updated provided to transitional care nurse.  Kristina Kelly 08/16/2022, 2:34 PM

## 2022-08-16 NOTE — Anesthesia Procedure Notes (Addendum)
Epidural Patient location during procedure: OB Start time: 08/16/2022 9:55 AM End time: 08/16/2022 10:07 AM  Staffing Anesthesiologist: Arita Miss, MD Resident/CRNA: Aline Brochure, CRNA Performed: resident/CRNA   Preanesthetic Checklist Completed: patient identified, IV checked, site marked, risks and benefits discussed, surgical consent, monitors and equipment checked, pre-op evaluation and timeout performed  Epidural Patient position: sitting Prep: ChloraPrep Patient monitoring: heart rate, continuous pulse ox and blood pressure Approach: midline Location: L4-L5 Injection technique: LOR air  Needle:  Needle type: Tuohy  Needle gauge: 17 G Needle length: 9 cm and 9 Needle insertion depth: 9 cm Catheter type: closed end flexible Catheter size: 19 Gauge Catheter at skin depth: 14 cm Test dose: negative and 1.5% lidocaine with Epi 1:200 K  Assessment Sensory level: T10 Events: blood not aspirated, no cerebrospinal fluid, injection not painful, no injection resistance, no paresthesia and negative IV test  Additional Notes 2 attempt Pt. Evaluated and documentation done after procedure finished. Patient identified. Risks/Benefits/Options discussed with patient including but not limited to bleeding, infection, nerve damage, paralysis, failed block, incomplete pain control, headache, blood pressure changes, nausea, vomiting, reactions to medication both or allergic, itching and postpartum back pain. Confirmed with bedside nurse the patient's most recent platelet count. Confirmed with patient that they are not currently taking any anticoagulation, have any bleeding history or any family history of bleeding disorders. Patient expressed understanding and wished to proceed. All questions were answered. Sterile technique was used throughout the entire procedure. Please see nursing notes for vital signs. Test dose was given through epidural catheter and negative prior to continuing to dose  epidural or start infusion. Warning signs of high block given to the patient including shortness of breath, tingling/numbness in hands, complete motor block, or any concerning symptoms with instructions to call for help. Patient was given instructions on fall risk and not to get out of bed. All questions and concerns addressed with instructions to call with any issues or inadequate analgesia.   Patient tolerated the insertion well without immediate complications.Reason for block:procedure for pain

## 2022-08-16 NOTE — Anesthesia Preprocedure Evaluation (Signed)
Anesthesia Evaluation  Patient identified by MRN, date of birth, ID band Patient awake    Reviewed: Allergy & Precautions, H&P , NPO status , Patient's Chart, lab work & pertinent test results  History of Anesthesia Complications Negative for: history of anesthetic complications  Airway Mallampati: II  TM Distance: >3 FB Neck ROM: full    Dental no notable dental hx.    Pulmonary neg pulmonary ROS   Pulmonary exam normal        Cardiovascular negative cardio ROS Normal cardiovascular exam     Neuro/Psych   Anxiety     negative neurological ROS  negative psych ROS   GI/Hepatic negative GI ROS, Neg liver ROS,,,  Endo/Other  negative endocrine ROS    Renal/GU negative Renal ROS  negative genitourinary   Musculoskeletal   Abdominal   Peds  Hematology negative hematology ROS (+)   Anesthesia Other Findings   Reproductive/Obstetrics (+) Pregnancy                             Anesthesia Physical Anesthesia Plan  ASA: 2  Anesthesia Plan: Epidural   Post-op Pain Management:    Induction:   PONV Risk Score and Plan:   Airway Management Planned:   Additional Equipment:   Intra-op Plan:   Post-operative Plan:   Informed Consent: I have reviewed the patients History and Physical, chart, labs and discussed the procedure including the risks, benefits and alternatives for the proposed anesthesia with the patient or authorized representative who has indicated his/her understanding and acceptance.     Dental Advisory Given  Plan Discussed with: Anesthesiologist and CRNA  Anesthesia Plan Comments:         Anesthesia Quick Evaluation

## 2022-08-16 NOTE — Progress Notes (Signed)
L&D Note    Subjective:  More uncomfortable with contractions, became more intense after AROM  Objective:   Vitals:   08/15/22 1337 08/15/22 1836 08/16/22 0045 08/16/22 0100  BP: 136/79 139/64 137/61   Pulse: 84 83 87   Resp: 16 17 19    Temp: 97.8 F (36.6 C) 98.2 F (36.8 C)  98.3 F (36.8 C)  TempSrc: Oral Oral  Oral  Weight:      Height:        Current Vital Signs 24h Vital Sign Ranges  T 98.3 F (36.8 C) Temp  Avg: 98.1 F (36.7 C)  Min: 97.8 F (36.6 C)  Max: 98.3 F (36.8 C)  BP 137/61 BP  Min: 136/79  Max: 139/64  HR 87 Pulse  Avg: 84.7  Min: 83  Max: 87  RR 19 Resp  Avg: 17.3  Min: 16  Max: 19  SaO2     No data recorded      Gen: alert, cooperative, no distress FHR: Baseline: 120 bpm, Variability: moderate, Accels: Present, Decels: none Toco: regular, every 2-4 minutes SVE: Dilation: 4.5 Effacement (%): 50 Station: -1 Presentation: Vertex Exam by:: [name] CNM  Medications SCHEDULED MEDICATIONS   misoprostol  25 mcg Vaginal Q4H   oxytocin 40 units in LR 1000 mL  333 mL Intravenous Once   sodium chloride flush  3 mL Intravenous Q12H    MEDICATION INFUSIONS   sodium chloride     lactated ringers     lactated ringers 125 mL/hr at 08/16/22 0657   oxytocin     oxytocin 18 milli-units/min (08/16/22 0611)    PRN MEDICATIONS  sodium chloride, acetaminophen, fentaNYL (SUBLIMAZE) injection, lactated ringers, lidocaine (PF), misoprostol **AND** misoprostol, ondansetron, sodium chloride flush, sodium citrate-citric acid, terbutaline   Assessment & Plan:  27 y.o. G2P1001 at [redacted]w[redacted]d admitted for elective IOL at term -Labor: AROM for clear fluid at 0722 -Fetal Well-being: Category I -GBS: negative -Membranes: Clear fluid, AROM  -Continue present management. -Analgesia: regional anesthesia requested    Kristina Kelly, CNM  08/16/2022 7:29 AM  [facility]

## 2022-08-16 NOTE — Discharge Summary (Signed)
Obstetrical Discharge Summary  Patient Name: Kristina Kelly DOB: 20-Nov-1995 MRN: [id number]  Date of Admission: 08/15/2022 Date of Delivery: 08/16/21 Delivered by: Avelino Leeds, CNM  Date of Discharge: 08/17/2022  Primary OB: [facility] [id number] last menstrual period was 11/15/2021 (exact date). EDC Estimated Date of Delivery: 08/22/22 Gestational Age at Delivery: [redacted]w[redacted]d   Antepartum complications:  Uterine size date discrepancy, third trimester Obesity History of Covid-19 Postpartum anxiety  Admitting Diagnosis: Indication for care in labor or delivery [O75.9]  Secondary Diagnosis: Patient Active Problem List   Diagnosis Date Noted   Indication for care in labor or delivery 08/15/2022   History of COVID-19 08/11/2022   Postpartum anxiety 08/11/2022   Uterine size date discrepancy, third trimester 06/13/2022   Obesity affecting pregnancy in third trimester 01/26/2022   Supervision of normal pregnancy 01/06/2022    Discharge Diagnosis: Term Pregnancy Delivered      Augmentation: AROM, Pitocin, and Cytotec Complications: None Intrapartum complications/course: Kristina Kelly presented to L&D for a scheduled IOL for elective at term.  Misoprostol was used initially for cervical ripening and then IV oxytocin for induction of labor.  She progressed well after AROM. She progressed  to C/C/+2 with an urge to push.  She pushed quickly over approximately 7 minutes for a spontaneous vaginal birth.  Delivery Type: spontaneous vaginal delivery Anesthesia: epidural anesthesia Placenta: spontaneous To Pathology: No  Laceration: none Episiotomy: none Newborn Data: Live born female  Birth Weight: 8 lb 0.4 oz (3640 g) APGAR: 8, 9  Newborn Delivery   Birth date/time: 08/16/2022 12:31:00 Delivery type: Vaginal, Spontaneous      Postpartum Procedures: none Edinburgh:     08/17/2022    6:00 AM 08/15/2019    8:55 AM  Edinburgh Postnatal Depression Scale Screening Tool  I have been  able to laugh and see the funny side of things. 0 0  I have looked forward with enjoyment to things. 0 0  I have blamed myself unnecessarily when things went wrong. 1 2  I have been anxious or worried for no good reason. 1 2  I have felt scared or panicky for no good reason. 1 1  Things have been getting on top of me. 0 0  I have been so unhappy that I have had difficulty sleeping. 0 0  I have felt sad or miserable. 1 1  I have been so unhappy that I have been crying. 0 1  The thought of harming myself has occurred to me. 0 0  Edinburgh Postnatal Depression Scale Total 4 7     Post partum course:  Patient had an uncomplicated postpartum course.  By time of discharge on PPD#1, her pain was controlled on oral pain medications; she had appropriate lochia and was ambulating, voiding without difficulty and tolerating regular diet.  She was deemed stable for discharge to home.    Discharge Physical Exam:  BP 130/85 (BP Location: Left Arm)   Pulse 76   Temp 97.7 F (36.5 C) (Oral)   Resp 18   Ht 5\' 9"  (1.753 m)   Wt 136.1 kg   LMP 11/15/2021 (Exact Date)   SpO2 97%   Breastfeeding Unknown   BMI 44.30 kg/m   General: NAD CV: RRR Pulm: CTABL, nl effort ABD: s/nd/nt, fundus firm and below the umbilicus Lochia: moderate Perineum:minimal edema/intact DVT Evaluation: LE non-ttp, no evidence of DVT on exam.  Hemoglobin  Date Value Ref Range Status  08/17/2022 10.5 (L) 12.0 - 15.0 g/dL Final  05/27/2019 12.3  11.1 - 15.9 g/dL Final   HCT  Date Value Ref Range Status  08/17/2022 31.4 (L) 36.0 - 46.0 % Final   Hematocrit  Date Value Ref Range Status  05/27/2019 37.0 34.0 - 46.6 % Final    Risk assessment for postpartum VTE and prophylactic treatment: Very high risk factors: None High risk factors: BMI 40-50 kg/m2 Moderate risk factors: None  Postpartum VTE prophylaxis with LMWH not indicated  Disposition: stable, discharge to home. Baby Feeding: breast feeding Baby  Disposition: home with mom  Rh Immune globulin indicated: No Rubella vaccine given: was not indicated Varivax vaccine given: was not indicated Flu vaccine given in AP setting: Yes  Tdap vaccine given in AP setting: Yes RSV vaccine given in AP setting: yes   Contraception: condoms  Prenatal Labs:  Blood type/Rh O POS Performed at [facility], [address] [place], [place] [address]    Antibody screen neg  Rubella Immune    Varicella Immune  RPR NR  HBsAg NR  Hep C NR  HIV NR  GC neg  Chlamydia neg  Genetic screening cfDNA negative  1 hour GTT 83  3 hour GTT N/A  GBS Neg   (copy from H&P)  Plan:  Kristina Kelly was discharged to home in good condition. Follow-up appointment with delivering provider in  2 weeks for a mood check and 6 weeks for PP visit  Discharge Medications: Allergies as of 08/17/2022       Reactions   Sulfa Antibiotics         Medication List     TAKE these medications    acetaminophen 500 MG tablet Commonly known as: TYLENOL Take 2 tablets (1,000 mg total) by mouth every 6 (six) hours.   benzocaine-Menthol 20-0.5 % Aero Commonly known as: DERMOPLAST Apply 1 Application topically as needed for irritation (perineal discomfort).   coconut oil Oil Apply 1 Application topically as needed.   diphenhydrAMINE 25 mg capsule Commonly known as: BENADRYL Take 1 capsule (25 mg total) by mouth every 6 (six) hours as needed for itching.   ferrous sulfate 325 (65 FE) MG tablet Take 1 tablet (325 mg total) by mouth 2 (two) times daily with a meal.   ibuprofen 600 MG tablet Commonly known as: ADVIL Take 1 tablet (600 mg total) by mouth every 6 (six) hours.   prenatal multivitamin Tabs tablet Take 1 tablet by mouth daily at 12 noon.   senna-docusate 8.6-50 MG tablet Commonly known as: Senokot-S Take 2 tablets by mouth daily.   simethicone 80 MG chewable tablet Commonly known as: MYLICON Chew 1 tablet (80 mg total) by mouth as  needed for flatulence.   witch hazel-glycerin pad Commonly known as: TUCKS Apply 1 Application topically as needed for hemorrhoids.         Follow-up Information     [name], CNM Follow up in 2 week(s).   Specialty: Certified Nurse Midwife Why: mood check Contact information: [place] [address] [phone]         [name], CNM Follow up in 6 week(s).   Specialty: Certified Nurse Midwife Why: PP visit Contact information: [place] [address] [phone]                 Signed: Francetta Found, [place] 08/17/2022 9:58 AM

## 2022-08-17 LAB — CBC
HCT: 31.4 % — ABNORMAL LOW (ref 36.0–46.0)
Hemoglobin: 10.5 g/dL — ABNORMAL LOW (ref 12.0–15.0)
MCH: 29.1 pg (ref 26.0–34.0)
MCHC: 33.4 g/dL (ref 30.0–36.0)
MCV: 87 fL (ref 80.0–100.0)
Platelets: 287 10*3/uL (ref 150–400)
RBC: 3.61 MIL/uL — ABNORMAL LOW (ref 3.87–5.11)
RDW: 13.4 % (ref 11.5–15.5)
WBC: 11.1 10*3/uL — ABNORMAL HIGH (ref 4.0–10.5)
nRBC: 0 % (ref 0.0–0.2)

## 2022-08-17 MED ORDER — BENZOCAINE-MENTHOL 20-0.5 % EX AERO: 1.0000 | INHALATION_SPRAY | CUTANEOUS | Status: AC | PRN

## 2022-08-17 MED ORDER — ACETAMINOPHEN 500 MG PO TABS: 1000.0000 mg | ORAL_TABLET | Freq: Four times a day (QID) | ORAL | 0 refills | Status: AC

## 2022-08-17 MED ORDER — COCONUT OIL OIL: 1.0000 | TOPICAL_OIL | 0 refills | Status: AC | PRN

## 2022-08-17 MED ORDER — FERROUS SULFATE 325 (65 FE) MG PO TABS: 325.0000 mg | ORAL_TABLET | Freq: Two times a day (BID) | ORAL | 0 refills | Status: AC

## 2022-08-17 MED ORDER — SIMETHICONE 80 MG PO CHEW: 80.0000 mg | CHEWABLE_TABLET | ORAL | 0 refills | Status: AC | PRN

## 2022-08-17 MED ORDER — WITCH HAZEL-GLYCERIN EX PADS: 1.0000 | MEDICATED_PAD | CUTANEOUS | 12 refills | Status: AC | PRN

## 2022-08-17 MED ORDER — SENNOSIDES-DOCUSATE SODIUM 8.6-50 MG PO TABS: 2.0000 | ORAL_TABLET | Freq: Every day | ORAL | 0 refills | Status: AC

## 2022-08-17 MED ORDER — DIPHENHYDRAMINE HCL 25 MG PO CAPS: 25.0000 mg | ORAL_CAPSULE | Freq: Four times a day (QID) | ORAL | 0 refills | Status: AC | PRN

## 2022-08-17 MED ORDER — PRENATAL MULTIVITAMIN CH: 1.0000 | ORAL_TABLET | Freq: Every day | ORAL | Status: AC

## 2022-08-17 MED ORDER — IBUPROFEN 600 MG PO TABS: 600.0000 mg | ORAL_TABLET | Freq: Four times a day (QID) | ORAL | 0 refills | Status: AC

## 2022-08-17 NOTE — Anesthesia Postprocedure Evaluation (Addendum)
Anesthesia Post Note  Patient: SARAI JANUARY  Procedure(s) Performed: AN AD HOC LABOR EPIDURAL  Patient location during evaluation: Mother Baby Anesthesia Type: Epidural Level of consciousness: awake and alert Pain management: pain level controlled Vital Signs Assessment: post-procedure vital signs reviewed and stable Respiratory status: spontaneous breathing, nonlabored ventilation and respiratory function stable Cardiovascular status: stable Postop Assessment: no headache, no backache and epidural receding Anesthetic complications: no   No notable events documented.   Last Vitals:  Vitals:   08/17/22 0347 08/17/22 0825  BP: 127/69 130/85  Pulse: 73 76  Resp: 20 18  Temp: 36.4 C 36.5 C  SpO2: 98% 97%    Last Pain:  Vitals:   08/17/22 0845  TempSrc:   PainSc: 0-No pain                 Nyellie Yetter Lorenza Chick

## 2022-08-17 NOTE — Lactation Note (Signed)
This note was copied from a baby's chart. Lactation Consultation Note  Patient Name: Kristina Kelly [id number] Date: 08/17/2022 Reason for consult: Follow-up assessment;Term Age:29 hours  Maternal Data Has patient been taught Hand Expression?: Yes Does the patient have breastfeeding experience prior to this delivery?: Yes  Mom is feeling well. Independent with breastfeeding overnight. Overall no concerns voiced.  Feeding Mother's Current Feeding Choice: Breast Milk  Baby with initial feeding difficulty on the left breast due to pain in upper right arm is improving per mom. Baby out of swaddle with arms up by face, no pain indicated.  Baby is feeding from both breasts in cradle hold (mom's preference). Voids and stools exceed minimum expectations for HOL.  LATCH Score     Lactation Tools Discussed/Used   Answered questions re: pump introduction and use for building supply, maintaining supply, and preparation for return to work.  Interventions Interventions: Breast feeding basics reviewed;Hand express;Pre-pump if needed;DEBP;Ice;Education;Pace feeding (back to work, pump introduction, signs of intake/adequate intake, signs of contentment, output expectations)  Encouraged continued on demand feedings, reviewed cluster feeding/growth spurts, reviewed signs of adequate intake and transfer. Tips given for keeping baby awake at the breast for full feedings/transfer.  Discharge Discharge Education: Engorgement and breast care;Warning signs for feeding baby;Outpatient recommendation Pump: Personal (Spectra and Baby Buddha)  Guidance given for anticipated breast changes and management of breast fullness and nipple care. Encouraged ice for swelling, and possible need to pre pump or softening tissue prior to latching.  Consult Status Consult Status: Complete  Outpatient lactation number given. Encouraged to call with questions and ongoing BF support as needed.  Kristina Kelly 08/17/2022, 9:56 AM

## 2022-08-17 NOTE — Progress Notes (Signed)
Post Partum Day 1 Subjective: Doing well, no complaints.  Tolerating regular diet, pain with PO meds, voiding and ambulating without difficulty.  No CP SOB Fever,Chills, N/V or leg pain; denies nipple or breast pain no HA change of vision, RUQ/epigastric pain  Objective: BP 130/85 (BP Location: Left Arm)   Pulse 76   Temp 97.7 F (36.5 C) (Oral)   Resp 18   Ht 5\' 9"  (1.753 m)   Wt 136.1 kg   LMP 11/15/2021 (Exact Date)   SpO2 97%   Breastfeeding Unknown   BMI 44.30 kg/m    Physical Exam:  General: NAD Breasts: soft/nontender CV: RRR Pulm: nl effort, CTABL Abdomen: soft, NT, BS x 4 Perineum: minimal edema, intact Lochia: small Uterine Fundus: fundus firm and 1 fb below umbilicus DVT Evaluation: no cords, ttp LEs   Recent Labs    08/15/22 1401 08/17/22 0316  HGB 11.5* 10.5*  HCT 35.0* 31.4*  WBC 9.8 11.1*  PLT 341 287    Assessment/Plan: 27 y.o. G2P2002 postpartum day # 1  - Continue routine PP care - Lactation consult prn.  - Discussed contraceptive options including implant, IUDs hormonal and non-hormonal, injection, pills/ring/patch, condoms, and NFP. Plans to use Condoms only.  - Acute blood loss anemia - hemodynamically stable and asymptomatic; start po ferrous sulfate BID with stool softeners  - Immunization status: all Imms up to date    Disposition: Does desire Dc home today.     Francetta Found, CNM 08/17/2022  9:50 AM

## 2022-08-17 NOTE — Progress Notes (Signed)
Patient discharged home with family. Discharge instructions, when to follow up, and prescriptions reviewed with patient. Patient verbalized understanding. Patient will be escorted out by auxiliary. Patient discharged home with family. Discharge instructions, when to follow up, and prescriptions reviewed with patient. Patient verbalized understanding. Patient will be escorted out by auxiliary.

## 2022-10-03 ENCOUNTER — Telehealth: Payer: Self-pay
